# Patient Record
Sex: Female | Born: 1980 | Race: Black or African American | Hispanic: No | Marital: Single | Smoking: Never smoker
Health system: Southern US, Community
[De-identification: ages and names within clinical notes are randomized; demographics above are authoritative.]

## PROBLEM LIST (undated history)

## (undated) DIAGNOSIS — I10 Essential (primary) hypertension: Secondary | ICD-10-CM

## (undated) DIAGNOSIS — J45909 Unspecified asthma, uncomplicated: Secondary | ICD-10-CM

## (undated) DIAGNOSIS — F419 Anxiety disorder, unspecified: Secondary | ICD-10-CM

## (undated) HISTORY — DX: Unspecified asthma, uncomplicated: J45.909

## (undated) HISTORY — DX: Anxiety disorder, unspecified: F41.9

## (undated) HISTORY — DX: Essential (primary) hypertension: I10

---
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
## 2001-01-27 ENCOUNTER — Inpatient Hospital Stay (HOSPITAL_COMMUNITY): Admission: AD | Admit: 2001-01-27 | Discharge: 2001-01-29 | Payer: Self-pay | Admitting: *Deleted

## 2002-04-02 ENCOUNTER — Ambulatory Visit (HOSPITAL_COMMUNITY): Admission: RE | Admit: 2002-04-02 | Discharge: 2002-04-02 | Payer: Self-pay | Admitting: *Deleted

## 2002-04-09 ENCOUNTER — Emergency Department (HOSPITAL_COMMUNITY): Admission: EM | Admit: 2002-04-09 | Discharge: 2002-04-09 | Payer: Self-pay | Admitting: Emergency Medicine

## 2002-07-10 ENCOUNTER — Inpatient Hospital Stay (HOSPITAL_COMMUNITY): Admission: AD | Admit: 2002-07-10 | Discharge: 2002-07-12 | Payer: Self-pay | Admitting: *Deleted

## 2003-07-05 ENCOUNTER — Ambulatory Visit (HOSPITAL_COMMUNITY): Admission: RE | Admit: 2003-07-05 | Discharge: 2003-07-05 | Payer: Self-pay | Admitting: *Deleted

## 2003-07-29 ENCOUNTER — Ambulatory Visit (HOSPITAL_COMMUNITY): Admission: RE | Admit: 2003-07-29 | Discharge: 2003-07-29 | Payer: Self-pay | Admitting: *Deleted

## 2003-08-13 ENCOUNTER — Inpatient Hospital Stay (HOSPITAL_COMMUNITY): Admission: AD | Admit: 2003-08-13 | Discharge: 2003-08-15 | Payer: Self-pay | Admitting: *Deleted

## 2005-02-22 ENCOUNTER — Ambulatory Visit (HOSPITAL_COMMUNITY): Admission: RE | Admit: 2005-02-22 | Discharge: 2005-02-22 | Payer: Self-pay | Admitting: *Deleted

## 2005-03-26 ENCOUNTER — Ambulatory Visit (HOSPITAL_COMMUNITY): Admission: RE | Admit: 2005-03-26 | Discharge: 2005-03-26 | Payer: Self-pay | Admitting: *Deleted

## 2005-05-23 ENCOUNTER — Encounter (INDEPENDENT_AMBULATORY_CARE_PROVIDER_SITE_OTHER): Payer: Self-pay | Admitting: Specialist

## 2005-05-23 ENCOUNTER — Inpatient Hospital Stay (HOSPITAL_COMMUNITY): Admission: AD | Admit: 2005-05-23 | Discharge: 2005-05-25 | Payer: Self-pay | Admitting: *Deleted

## 2005-05-23 ENCOUNTER — Ambulatory Visit: Payer: Self-pay | Admitting: *Deleted

## 2008-07-05 ENCOUNTER — Emergency Department (HOSPITAL_COMMUNITY): Admission: EM | Admit: 2008-07-05 | Discharge: 2008-07-06 | Payer: Self-pay | Admitting: Emergency Medicine

## 2009-08-10 ENCOUNTER — Emergency Department (HOSPITAL_COMMUNITY): Admission: EM | Admit: 2009-08-10 | Discharge: 2009-08-10 | Payer: Self-pay | Admitting: Emergency Medicine

## 2010-04-12 ENCOUNTER — Emergency Department (HOSPITAL_COMMUNITY): Admission: EM | Admit: 2010-04-12 | Discharge: 2010-04-12 | Payer: Self-pay | Admitting: Family Medicine

## 2010-11-30 NOTE — Op Note (Signed)
NAME:  Terry Osborn, Terry Osborn           ACCOUNT NO.:  [id number]   MEDICAL RECORD NO.:  [id number]          PATIENT TYPE:  INP   LOCATION:  9135                          FACILITY:  [facility]   PHYSICIAN:  Conni Elliot, M.D.DATE OF BIRTH:  1981/06/01   DATE OF PROCEDURE:  05/23/2005  DATE OF DISCHARGE:  05/25/2005                                 OPERATIVE REPORT   PREOPERATIVE DIAGNOSIS:  Multiparity, desires permanent sterilization.   POSTOPERATIVE DIAGNOSIS:  Multiparity, desires permanent sterilization.   PROCEDURE:  Postpartum tubal ligation.   SURGEON:  Conni Elliot, M.D.   ASSISTANT:  Marc Morgans. Mayford Knife, M.D.   ANESTHESIA:  Epidural.   COMPLICATIONS:  None.   ESTIMATED BLOOD LOSS:  Less than 25 mL.   INDICATIONS FOR PROCEDURE:  The patient is a 30 year old multiparous female,  status post normal spontaneous vaginal delivery, who desires permanent  sterilization.  Risks and benefits of the procedure discussed with the  patient including risk of failure of 3-5 per 1000 with increased risk of  ectopic gestation if pregnancy does occur.   FINDINGS:  Normal uterus, tubes, and ovaries.   DESCRIPTION OF PROCEDURE:  The patient was taken to the operating room where  epidural anesthesia was found to be adequate.  A small transverse  infraumbilical incision was then made with the scalpel.  The incision was  carried down to the underlying fascia until the peritoneum was identified  and entered.  The peritoneum was noted to be free of any adhesions and the  incision was extended with Mayo scissors.  The patient's right fallopian  tube was then identified, brought to the incision, and grasped with Babcock  clamps.  The tube was followed out to the fimbria.  The Babcock clamp was  then used to grasp the tube approximately 4 cm from the cornual region.  A 2-  0 chromic on a SH needle was then used and approximately a 3 cm segment of  the tube was ligated.  A second free tie of 1-0  chromic was used to compare  to the previous suture.  Next, a segment of the tube was excised.  Hemostasis was noted and the tube was adherent to the abdomen.   The left fallopian tube was then grasped with a Babcock clamp and followed  out to the fimbria and then in a similar fashion, a 2-0 chromic on an SH  needle was used and approximately a 3 cm segment of the tube was made into a  knuckle and ligated.  A second free tie of 1-0 chromic was used as inferior  to the previous suture.  That part of the tube was excised.  Excellent  hemostasis was also noted and the tube was returned to the abdomen.  The  peritoneum was closed with 0 chromic on a CT1.  The fascia was closed in a  single layer using 0 Vicryl.  Subcutaneous tissue was closed with a 3-0  Monocryl.  Finally, the skin was closed with 4-0 Vicryl.  The patient  tolerated the procedure  well.  Needle, sponge, and instrument counts correct x2.  The patient  was  taken to the recovery room in stable condition.   PATHOLOGY:  Segments of the right and left fallopian tube.     ______________________________  Marc Morgans Mayford Knife, M.D.    ______________________________  Conni Elliot, M.D.    TLW/MEDQ  D:  05/27/2005  T:  05/28/2005  Job:  [id number]

## 2011-04-18 LAB — URINALYSIS, ROUTINE W REFLEX MICROSCOPIC
Glucose, UA: NEGATIVE mg/dL
Hgb urine dipstick: NEGATIVE
Ketones, ur: NEGATIVE mg/dL
Protein, ur: NEGATIVE mg/dL
Urobilinogen, UA: 0.2 mg/dL (ref 0.0–1.0)

## 2011-04-18 LAB — POCT I-STAT, CHEM 8
BUN: 9 mg/dL (ref 6–23)
Calcium, Ion: 1.21 mmol/L (ref 1.12–1.32)
Chloride: 102 mEq/L (ref 96–112)
Creatinine, Ser: 0.9 mg/dL (ref 0.4–1.2)
Glucose, Bld: 104 mg/dL — ABNORMAL HIGH (ref 70–99)
TCO2: 30 mmol/L (ref 0–100)

## 2012-06-30 ENCOUNTER — Encounter (HOSPITAL_COMMUNITY): Payer: Self-pay | Admitting: *Deleted

## 2012-06-30 DIAGNOSIS — R0789 Other chest pain: Secondary | ICD-10-CM | POA: Insufficient documentation

## 2012-06-30 DIAGNOSIS — R059 Cough, unspecified: Secondary | ICD-10-CM | POA: Insufficient documentation

## 2012-06-30 DIAGNOSIS — Z79899 Other long term (current) drug therapy: Secondary | ICD-10-CM | POA: Insufficient documentation

## 2012-06-30 DIAGNOSIS — R0602 Shortness of breath: Secondary | ICD-10-CM | POA: Insufficient documentation

## 2012-06-30 DIAGNOSIS — J069 Acute upper respiratory infection, unspecified: Secondary | ICD-10-CM | POA: Insufficient documentation

## 2012-06-30 DIAGNOSIS — Z8709 Personal history of other diseases of the respiratory system: Secondary | ICD-10-CM | POA: Insufficient documentation

## 2012-06-30 DIAGNOSIS — J029 Acute pharyngitis, unspecified: Secondary | ICD-10-CM | POA: Insufficient documentation

## 2012-06-30 DIAGNOSIS — R05 Cough: Secondary | ICD-10-CM | POA: Insufficient documentation

## 2012-06-30 LAB — CBC WITH DIFFERENTIAL/PLATELET
Basophils Absolute: 0 10*3/uL (ref 0.0–0.1)
Eosinophils Absolute: 0.3 10*3/uL (ref 0.0–0.7)
Eosinophils Relative: 5 % (ref 0–5)
HCT: 39.6 % (ref 36.0–46.0)
MCH: 31 pg (ref 26.0–34.0)
MCV: 87.6 fL (ref 78.0–100.0)
Monocytes Absolute: 0.5 10*3/uL (ref 0.1–1.0)
Platelets: 284 10*3/uL (ref 150–400)
RDW: 12 % (ref 11.5–15.5)

## 2012-06-30 LAB — COMPREHENSIVE METABOLIC PANEL
ALT: 11 U/L (ref 0–35)
AST: 19 U/L (ref 0–37)
CO2: 27 mEq/L (ref 19–32)
Calcium: 9.5 mg/dL (ref 8.4–10.5)
Creatinine, Ser: 0.9 mg/dL (ref 0.50–1.10)
GFR calc Af Amer: >90 mL/min (ref >90)
GFR calc non Af Amer: 85 mL/min — ABNORMAL LOW (ref >90)
Glucose, Bld: 96 mg/dL (ref 70–99)
Sodium: 138 mEq/L (ref 135–145)
Total Protein: 7.6 g/dL (ref 6.0–8.3)

## 2012-06-30 NOTE — ED Notes (Signed)
The pt is c/o chest tightness for the past 1-2 days.  It started  Causing anxiety tonight.  Also c/o sob none now.  lmp  2 weeks ago

## 2012-07-01 ENCOUNTER — Emergency Department (HOSPITAL_COMMUNITY): Payer: Medicaid Other

## 2012-07-01 ENCOUNTER — Emergency Department (HOSPITAL_COMMUNITY)
Admission: EM | Admit: 2012-07-01 | Discharge: 2012-07-01 | Disposition: A | Discharge status: Home / Self Care | Payer: Medicaid Other | Attending: Emergency Medicine | Admitting: Emergency Medicine

## 2012-07-01 DIAGNOSIS — R0789 Other chest pain: Secondary | ICD-10-CM

## 2012-07-01 DIAGNOSIS — J069 Acute upper respiratory infection, unspecified: Secondary | ICD-10-CM

## 2012-07-01 MED ORDER — IPRATROPIUM BROMIDE 0.03 % NA SOLN: 2.0000 | Freq: Two times a day (BID) | NASAL | Status: DC

## 2012-07-01 MED ORDER — BENZONATATE 100 MG PO CAPS: 200.0000 mg | ORAL_CAPSULE | Freq: Two times a day (BID) | ORAL | Status: DC | PRN

## 2012-07-01 MED ORDER — AEROCHAMBER PLUS W/MASK MISC
Status: AC
  Administered 2012-07-01: 01:00:00
  Filled 2012-07-01: qty 1

## 2012-07-01 MED ORDER — ALBUTEROL SULFATE HFA 108 (90 BASE) MCG/ACT IN AERS
2.0000 | INHALATION_SPRAY | Freq: Once | RESPIRATORY_TRACT | Status: AC
  Administered 2012-07-01: 2 via RESPIRATORY_TRACT
  Filled 2012-07-01: qty 6.7

## 2012-07-01 MED ORDER — GUAIFENESIN ER 600 MG PO TB12: 1200.0000 mg | ORAL_TABLET | Freq: Two times a day (BID) | ORAL | Status: DC

## 2012-07-01 NOTE — ED Provider Notes (Signed)
History     CSN: [id number]  Arrival date & time 06/30/12  2155   None     Chief Complaint  Patient presents with  . Chest Pain    (Consider location/radiation/quality/duration/timing/severity/associated sxs/prior treatment) The history is provided by the patient and medical records.    Terry Osborn is a 31 y.o. female  with a hx of childhood asthma presents to the Emergency Department complaining of gradual, persistent, progressively worsening chest pain onset 2 days ago.  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  Pt denies trauma, lifting anything heavy or new/ strenuous activities.  Pt rates her pain at a 5/10, located substernal, non radiating, described as tightness worse with breathing in.  Associated symptoms include rhinorrhea, sore throat, sinus pressure.   Nothing makes it better and deep breaths makes it worse.  Pt denies fever, chills, headaches, abdominal pain, nausea, vomiting, diarrhea, weakness, dizziness, syncope, dysuria.     History reviewed. No pertinent past medical history.  History reviewed. No pertinent past surgical history.  No family history on file.  History  Substance Use Topics  . Smoking status: Never Smoker   . Smokeless tobacco: Not on file  . Alcohol Use: No    OB History    Grav Para Term Preterm Abortions TAB SAB Ect Mult Living                  Review of Systems  Constitutional: Negative for fever, diaphoresis, appetite change, fatigue and unexpected weight change.  HENT: Positive for congestion, sore throat, rhinorrhea and postnasal drip. Negative for mouth sores, neck pain and neck stiffness.   Eyes: Negative for visual disturbance.  Respiratory: Positive for cough, chest tightness and shortness of breath. Negative for wheezing.   Cardiovascular: Negative for chest pain.  Gastrointestinal: Negative for nausea, vomiting, abdominal pain, diarrhea and constipation.  Genitourinary: Negative for  dysuria, urgency, frequency and hematuria.  Skin: Negative for rash.  Neurological: Negative for syncope, light-headedness and headaches.  Psychiatric/Behavioral: Negative for sleep disturbance. The patient is not nervous/anxious.   All other systems reviewed and are negative.    Allergies  Review of patient's allergies indicates no known allergies.  Home Medications   Current Outpatient Rx  Name  Route  Sig  Dispense  Refill  . VALSARTAN-HYDROCHLOROTHIAZIDE 160-12.5 MG PO TABS   Oral   Take 1 tablet by mouth daily.         Marland Kitchen BENZONATATE 100 MG PO CAPS   Oral   Take 2 capsules (200 mg total) by mouth 2 (two) times daily as needed for cough.   20 capsule   0   . GUAIFENESIN ER 600 MG PO TB12   Oral   Take 2 tablets (1,200 mg total) by mouth 2 (two) times daily.   20 tablet   0   . IPRATROPIUM BROMIDE 0.03 % NA SOLN   Nasal   Place 2 sprays into the nose 2 (two) times daily. PRN congestion   30 mL   0     BP 154/85  Pulse 88  Temp 99 F (37.2 C) (Oral)  Resp 18  SpO2 100%  LMP 06/16/2012  Physical Exam  Nursing note and vitals reviewed. Constitutional: She is oriented to person, place, and time. She appears well-developed and well-nourished. No distress.  HENT:  Head: Normocephalic and atraumatic.  Right Ear: Tympanic membrane, external ear and ear canal normal.  Left Ear: Tympanic membrane, external ear and ear  canal normal.  Nose: Nose normal. Right sinus exhibits no maxillary sinus tenderness and no frontal sinus tenderness. Left sinus exhibits no maxillary sinus tenderness and no frontal sinus tenderness.  Mouth/Throat: Uvula is midline, oropharynx is clear and moist and mucous membranes are normal. No oropharyngeal exudate, posterior oropharyngeal edema, posterior oropharyngeal erythema or tonsillar abscesses.  Eyes: Conjunctivae normal and EOM are normal. Pupils are equal, round, and reactive to light. No scleral icterus.  Neck: Normal range of motion.  Neck supple.  Cardiovascular: Normal rate, regular rhythm, normal heart sounds and intact distal pulses.  Exam reveals no gallop and no friction rub.   No murmur heard. Pulmonary/Chest: Effort normal. No accessory muscle usage. Not tachypneic. No respiratory distress. She has decreased breath sounds (throughout). She has no wheezes. She has no rhonchi. She has no rales. She exhibits no tenderness.  Abdominal: Soft. Bowel sounds are normal. She exhibits no mass. There is no tenderness. There is no rigidity, no rebound, no guarding, no CVA tenderness, no tenderness at McBurney's point and negative Murphy's sign.  Musculoskeletal: Normal range of motion. She exhibits no edema.  Lymphadenopathy:    She has no cervical adenopathy.  Neurological: She is alert and oriented to person, place, and time. She exhibits normal muscle tone. Coordination normal.       Speech is clear and goal oriented Moves extremities without ataxia  Skin: Skin is warm and dry. No rash noted. She is not diaphoretic. No erythema.  Psychiatric: Her mood appears anxious (mildly).    ED Course  Procedures (including critical care time)  Labs Reviewed  COMPREHENSIVE METABOLIC PANEL - Abnormal; Notable for the following:    GFR calc non Af Amer 85 (*)     All other components within normal limits  CBC WITH DIFFERENTIAL  TROPONIN I   Dg Chest 2 View  07/01/2012  *RADIOLOGY REPORT*  Clinical Data: Chest pain  CHEST - 2 VIEW  Comparison: None.  Findings: Lungs are clear.  No pleural effusion or pneumothorax.  Cardiomediastinal silhouette is within normal limits.  Visualized osseous structures are within normal limits.  IMPRESSION: No evidence of acute cardiopulmonary disease.   Original Report Authenticated By: Charline Bills, M.D.     Results for orders placed during the hospital encounter of 07/01/12  CBC WITH DIFFERENTIAL      Component Value Range   WBC 6.8  4.0 - 10.5 K/uL   RBC 4.52  3.87 - 5.11 MIL/uL    Hemoglobin 14.0  12.0 - 15.0 g/dL   HCT 16.1  09.6 - 04.5 %   MCV 87.6  78.0 - 100.0 fL   MCH 31.0  26.0 - 34.0 pg   MCHC 35.4  30.0 - 36.0 g/dL   RDW 40.9  81.1 - 91.4 %   Platelets 284  150 - 400 K/uL   Neutrophils Relative 51  43 - 77 %   Neutro Abs 3.5  1.7 - 7.7 K/uL   Lymphocytes Relative 37  12 - 46 %   Lymphs Abs 2.5  0.7 - 4.0 K/uL   Monocytes Relative 7  3 - 12 %   Monocytes Absolute 0.5  0.1 - 1.0 K/uL   Eosinophils Relative 5  0 - 5 %   Eosinophils Absolute 0.3  0.0 - 0.7 K/uL   Basophils Relative 1  0 - 1 %   Basophils Absolute 0.0  0.0 - 0.1 K/uL  COMPREHENSIVE METABOLIC PANEL      Component Value Range   Sodium  138  135 - 145 mEq/L   Potassium 3.8  3.5 - 5.1 mEq/L   Chloride 100  96 - 112 mEq/L   CO2 27  19 - 32 mEq/L   Glucose, Bld 96  70 - 99 mg/dL   BUN 9  6 - 23 mg/dL   Creatinine, Ser 1.61  0.50 - 1.10 mg/dL   Calcium 9.5  8.4 - 09.6 mg/dL   Total Protein 7.6  6.0 - 8.3 g/dL   Albumin 3.9  3.5 - 5.2 g/dL   AST 19  0 - 37 U/L   ALT 11  0 - 35 U/L   Alkaline Phosphatase 55  39 - 117 U/L   Total Bilirubin 0.3  0.3 - 1.2 mg/dL   GFR calc non Af Amer 85 (*) >90 mL/min   GFR calc Af Amer >90  >90 mL/min  TROPONIN I      Component Value Range   Troponin I <0.30  <0.30 ng/mL   No results found.   ECG:  Date: 07/01/2012  Rate: 85  Rhythm: normal sinus rhythm  QRS Axis: normal  Intervals: normal  ST/T Wave abnormalities: normal  Conduction Disutrbances:none  Narrative Interpretation: Nonischemic ECG  Old EKG Reviewed: none available    1. URI (upper respiratory infection)   2. Chest tightness       MDM  Terry Osborn presents with chest tightness, mild URI symptoms and some anxiety.  On reevaluation lung sounds have increased tidal volume without wheezing or rhonchi. ECG nonischemic, CMP, CBC unremarkable, troponin negative and chest x-ray without acute infiltrate, effusion or pneumothorax. Patient is to be discharged with recommendation to  follow up with PCP in regards to today's hospital visit. Chest pain is not likely of cardiac or pulmonary etiology d/t presentation, perc negative, VSS, no tracheal deviation, no JVD or new murmur, RRR, breath sounds equal bilaterally, EKG without acute abnormalities, negative troponin, and negative CXR. Chest tightness resolved with albuterol administration, this is most likely URI in nature. Pt has been given URI treatments, albuterol MDI and advised to return to the ED if CP becomes exertional, associated with diaphoresis or nausea, radiates to left jaw/arm, worsens or becomes concerning in any way. Pt appears reliable for follow up and is agreeable to discharge.   Case has been discussed with Dr. Sunnie Nielsen who agrees with the above plan to discharge.         Terry Client Mystic Labo, PA-C 07/01/12 [phone]

## 2012-07-04 NOTE — ED Provider Notes (Signed)
Medical screening examination/treatment/procedure(s) were performed by non-physician practitioner and as supervising physician I was immediately available for consultation/collaboration.  Sunnie Nielsen, MD 07/04/12 [phone]

## 2013-09-16 ENCOUNTER — Encounter: Payer: Self-pay | Admitting: Advanced Practice Midwife

## 2015-04-28 ENCOUNTER — Emergency Department (HOSPITAL_COMMUNITY)
Admission: EM | Admit: 2015-04-28 | Discharge: 2015-04-28 | Disposition: A | Discharge status: Home / Self Care | Payer: Medicaid Other | Attending: Emergency Medicine | Admitting: Emergency Medicine

## 2015-04-28 ENCOUNTER — Emergency Department (HOSPITAL_COMMUNITY)
Admission: EM | Admit: 2015-04-28 | Discharge: 2015-04-28 | Discharge status: Absent without leave | Payer: Medicaid Other | Source: Home / Self Care

## 2015-04-28 ENCOUNTER — Encounter (HOSPITAL_COMMUNITY): Payer: Self-pay | Admitting: Family Medicine

## 2015-04-28 ENCOUNTER — Emergency Department (HOSPITAL_COMMUNITY): Payer: Medicaid Other

## 2015-04-28 DIAGNOSIS — F458 Other somatoform disorders: Secondary | ICD-10-CM | POA: Insufficient documentation

## 2015-04-28 DIAGNOSIS — Z79899 Other long term (current) drug therapy: Secondary | ICD-10-CM | POA: Insufficient documentation

## 2015-04-28 DIAGNOSIS — F419 Anxiety disorder, unspecified: Secondary | ICD-10-CM | POA: Diagnosis not present

## 2015-04-28 LAB — BASIC METABOLIC PANEL
Anion gap: 8 (ref 5–15)
BUN: 8 mg/dL (ref 6–20)
CALCIUM: 9.6 mg/dL (ref 8.9–10.3)
CO2: 29 mmol/L (ref 22–32)
CREATININE: 0.86 mg/dL (ref 0.44–1.00)
Chloride: 102 mmol/L (ref 101–111)
GFR calc non Af Amer: >60 mL/min (ref >60)
Glucose, Bld: 101 mg/dL — ABNORMAL HIGH (ref 65–99)
Potassium: 3.1 mmol/L — ABNORMAL LOW (ref 3.5–5.1)
SODIUM: 139 mmol/L (ref 135–145)

## 2015-04-28 LAB — CBC
HCT: 40.3 % (ref 36.0–46.0)
Hemoglobin: 13.9 g/dL (ref 12.0–15.0)
MCH: 30.2 pg (ref 26.0–34.0)
MCHC: 34.5 g/dL (ref 30.0–36.0)
MCV: 87.6 fL (ref 78.0–100.0)
PLATELETS: 309 10*3/uL (ref 150–400)
RBC: 4.6 MIL/uL (ref 3.87–5.11)
RDW: 12.2 % (ref 11.5–15.5)
WBC: 5.6 10*3/uL (ref 4.0–10.5)

## 2015-04-28 LAB — I-STAT TROPONIN, ED: TROPONIN I, POC: 0 ng/mL (ref 0.00–0.08)

## 2015-04-28 MED ORDER — LORAZEPAM 0.5 MG PO TABS
1.0000 mg | ORAL_TABLET | Freq: Once | ORAL | Status: AC
  Administered 2015-04-28: 1 mg via ORAL
  Filled 2015-04-28: qty 2

## 2015-04-28 MED ORDER — POTASSIUM CHLORIDE CRYS ER 20 MEQ PO TBCR
40.0000 meq | EXTENDED_RELEASE_TABLET | Freq: Once | ORAL | Status: AC
  Administered 2015-04-28: 40 meq via ORAL
  Filled 2015-04-28: qty 2

## 2015-04-28 NOTE — ED Provider Notes (Signed)
CSN: [id number]     Arrival date & time 04/28/15  1417 History  By signing my name below, I, Soijett Blue, attest that this documentation has been prepared under the direction and in the presence of Roxy Horsemanobert Jermaine Tholl, PA-C Electronically Signed: Soijett Blue, ED Scribe. 04/28/2015. 7:39 PM.   Chief Complaint  Patient presents with  . Anxiety      The history is provided by the patient. No language interpreter was used.    HPI Comments: Terry Osborn is a [age] y.o. female who presents to the Emergency Department complaining of anxiety onset earlier today. She notes that she has an un documented hx of anxiety and she takes vistaril for her symptoms PRN. She thinks that her anxiety medications are no longer working for her symptoms especially today. She states that she is having associated symptoms of CP due to anxiety and difficulty breathing, all of which have resolved since being in the ED. She states that she has tried vistaril with no relief for her symptoms. She denies any other symptoms.   History reviewed. No pertinent past medical history. History reviewed. No pertinent past surgical history. History reviewed. No pertinent family history. Social History  Substance Use Topics  . Smoking status: Never Smoker   . Smokeless tobacco: None  . Alcohol Use: No   OB History    No data available     Review of Systems  Constitutional: Negative for fever and chills.       Anxiety that is resolved  HENT:       Globus sensation  Respiratory: Negative for shortness of breath.   Cardiovascular: Positive for chest pain (resolved).  Gastrointestinal: Negative for nausea, vomiting, diarrhea and constipation.  Genitourinary: Negative for dysuria.  All other systems reviewed and are negative.     Allergies  Review of patient's allergies indicates no known allergies.  Home Medications   Prior to Admission medications   Medication Sig Start Date End Date Taking? Authorizing Provider   Aspirin-Acetaminophen-Caffeine (GOODY HEADACHE PO) Take 1 packet by mouth daily as needed (headache).   Yes Historical Provider, MD  hydrOXYzine (VISTARIL) 25 MG capsule Take 25 mg by mouth daily as needed for anxiety.   Yes Historical Provider, MD  losartan-hydrochlorothiazide (HYZAAR) 50-12.5 MG tablet Take 1 tablet by mouth daily. 04/04/15  Yes Historical Provider, MD  benzonatate (TESSALON) 100 MG capsule Take 2 capsules (200 mg total) by mouth 2 (two) times daily as needed for cough. Patient not taking: Reported on 04/28/2015 07/01/12   Dahlia ClientHannah Muthersbaugh, PA-C  guaiFENesin (MUCINEX) 600 MG 12 hr tablet Take 2 tablets (1,200 mg total) by mouth 2 (two) times daily. Patient not taking: Reported on 04/28/2015 07/01/12   Dahlia ClientHannah Muthersbaugh, PA-C  ipratropium (ATROVENT) 0.03 % nasal spray Place 2 sprays into the nose 2 (two) times daily. PRN congestion Patient not taking: Reported on 04/28/2015 07/01/12   Dahlia ClientHannah Muthersbaugh, PA-C   BP 139/84 mmHg  Pulse 107  Temp(Src) 98.8 F (37.1 C) (Oral)  Resp 22  SpO2 98%  LMP 04/14/2015 Physical Exam  Constitutional: She is oriented to person, place, and time. She appears well-developed and well-nourished. No distress.  HENT:  Head: Normocephalic and atraumatic.  Eyes: Conjunctivae and EOM are normal. Pupils are equal, round, and reactive to light.  Neck: Normal range of motion. Neck supple.  Cardiovascular: Normal rate, regular rhythm and normal heart sounds.  Exam reveals no gallop and no friction rub.   No murmur heard. Pulmonary/Chest: Effort normal and breath  sounds normal. No respiratory distress. She has no wheezes. She has no rales. She exhibits no tenderness.  Abdominal: Soft. She exhibits no distension and no mass. There is no tenderness. There is no rebound and no guarding.  Musculoskeletal: Normal range of motion. She exhibits no edema or tenderness.  Neurological: She is alert and oriented to person, place, and time.  Skin: Skin is  warm and dry.  Psychiatric: She has a normal mood and affect. Her behavior is normal. Judgment and thought content normal.  Nursing note and vitals reviewed.   ED Course  Procedures (including critical care time) DIAGNOSTIC STUDIES: Oxygen Saturation is 98% on RA, nl by my interpretation.    COORDINATION OF CARE: 7:38 PM Discussed treatment plan with pt at bedside which includes potassium tablet, CXR, EKG, labs, ativan, and continue with vistaril and pt agreed to plan.    Labs Review Labs Reviewed  BASIC METABOLIC PANEL - Abnormal; Notable for the following:    Potassium 3.1 (*)    Glucose, Bld 101 (*)    All other components within normal limits  CBC  I-STAT TROPOININ, ED    Imaging Review Dg Chest 2 View  04/28/2015  CLINICAL DATA:  Chest pain for 1 day EXAM: CHEST  2 VIEW COMPARISON:  07/01/2012 FINDINGS: There is no focal parenchymal opacity. There is no pleural effusion or pneumothorax. The heart and mediastinal contours are unremarkable. There is gaseous distension of the small bowel and colon. The osseous structures are unremarkable. IMPRESSION: No active cardiopulmonary disease. Electronically Signed   By: Elige Ko   On: 04/28/2015 18:37   I have personally reviewed and evaluated these images and lab results as part of my medical decision-making.   EKG Interpretation   Date/Time:  Friday April 28 2015 14:30:10 EDT Ventricular Rate:  92 PR Interval:  168 QRS Duration: 86 QT Interval:  340 QTC Calculation: 420 R Axis:   86 Text Interpretation:  Normal sinus rhythm Normal ECG No significant change  since last tracing Confirmed by LITTLE MD, RACHEL ([id number]) on 04/28/2015  6:28:42 PM      MDM   Final diagnoses:  Anxiety    Patient with anxiety.  Reports feeling much better after being moved out of the waiting room. She states that she has a history of the same, and takes hydroxyzine as needed. She states that this did not control her symptoms today. Her  laboratory studies are reassuring. She was given a dose potassium in the emergency department. EKG is reassuring. Low risk for PE or DVT. No leg swelling. Patient is stable and ready for discharge.  I, Jancy Sprankle, personally performed the services described in this documentation. All medical record entries made by the scribe were at my direction and in my presence.  I have reviewed the chart and discharge instructions and agree that the record reflects my personal performance and is accurate and complete. Brooksie Ellwanger.  04/28/2015. 8:12 PM.      Roxy Horseman, PA-C 04/28/15 2013  Glynn Octave, MD 04/28/15 [phone]

## 2015-04-28 NOTE — ED Notes (Signed)
Pt here for anxiety. sts chest pain associated with it. Pt crying at triage. sts anxiety meds aren't helping.

## 2015-04-28 NOTE — Discharge Instructions (Signed)
°Panic Attacks °Panic attacks are sudden, short-lived surges of severe anxiety, fear, or discomfort. They may occur for no reason when you are relaxed, when you are anxious, or when you are sleeping. Panic attacks may occur for a number of reasons:  °· Healthy people occasionally have panic attacks in extreme, life-threatening situations, such as war or natural disasters. Normal anxiety is a protective mechanism of the body that helps us react to danger (fight or flight response). °· Panic attacks are often seen with anxiety disorders, such as panic disorder, social anxiety disorder, generalized anxiety disorder, and phobias. Anxiety disorders cause excessive or uncontrollable anxiety. They may interfere with your relationships or other life activities. °· Panic attacks are sometimes seen with other mental illnesses, such as depression and posttraumatic stress disorder. °· Certain medical conditions, prescription medicines, and drugs of abuse can cause panic attacks. °SYMPTOMS  °Panic attacks start suddenly, peak within 20 minutes, and are accompanied by four or more of the following symptoms: °· Pounding heart or fast heart rate (palpitations). °· Sweating. °· Trembling or shaking. °· Shortness of breath or feeling smothered. °· Feeling choked. °· Chest pain or discomfort. °· Nausea or strange feeling in your stomach. °· Dizziness, light-headedness, or feeling like you will faint. °· Chills or hot flushes. °· Numbness or tingling in your lips or hands and feet. °· Feeling that things are not real or feeling that you are not yourself. °· Fear of losing control or going crazy. °· Fear of dying. °Some of these symptoms can mimic serious medical conditions. For example, you may think you are having a heart attack. Although panic attacks can be very scary, they are not life threatening. °DIAGNOSIS  °Panic attacks are diagnosed through an assessment by your health care provider. Your health care provider will ask  questions about your symptoms, such as where and when they occurred. Your health care provider will also ask about your medical history and use of alcohol and drugs, including prescription medicines. Your health care provider may order blood tests or other studies to rule out a serious medical condition. Your health care provider may refer you to a mental health professional for further evaluation. °TREATMENT  °· Most healthy people who have one or two panic attacks in an extreme, life-threatening situation will not require treatment. °· The treatment for panic attacks associated with anxiety disorders or other mental illness typically involves counseling with a mental health professional, medicine, or a combination of both. Your health care provider will help determine what treatment is best for you. °· Panic attacks due to physical illness usually go away with treatment of the illness. If prescription medicine is causing panic attacks, talk with your health care provider about stopping the medicine, decreasing the dose, or substituting another medicine. °· Panic attacks due to alcohol or drug abuse go away with abstinence. Some adults need professional help in order to stop drinking or using drugs. °HOME CARE INSTRUCTIONS  °· Take all medicines as directed by your health care provider.   °· Schedule and attend follow-up visits as directed by your health care provider. It is important to keep all your appointments. °SEEK MEDICAL CARE IF: °· You are not able to take your medicines as prescribed. °· Your symptoms do not improve or get worse. °SEEK IMMEDIATE MEDICAL CARE IF:  °· You experience panic attack symptoms that are different than your usual symptoms. °· You have serious thoughts about hurting yourself or others. °· You are taking medicine for panic attacks and   have a serious side effect. MAKE SURE YOU:  Understand these instructions.  Will watch your condition.  Will get help right away if you are not  doing well or get worse.   This information is not intended to replace advice given to you by your health care provider. Make sure you discuss any questions you have with your health care provider.   Document Released: 07/01/2005 Document Revised: 07/06/2013 Document Reviewed: 02/12/2013 Elsevier Interactive Patient Education 2016 [employer]    [facility] Resources in the [employer]  Intensive Outpatient Programs: [facility]      [address] [place], [place] [phone] Both a day and evening program       [facility]     [address]        [place], [place] [address] [phone]         [facility] [address] [place] [phone]  [facility] ACCESS LINE: [phone] or [phone] [address] [place], [place] [address] [email or web address]  Mobile Crisis Teams:                                        [facility]         Mobile Crisis Care Unit [phone]             [facility] [address] [place] [phone]                                         [facility] [address] [place] [phone]  Self-Help/Support Groups: [facility] [place] Variety of support groups [phone] (call for more info)  Narcotics Anonymous (NA) [facility] [address] [place] - 2 meetings at this location  Residential Treatment Programs:  [facility]      [address]        [place]       [phone]         [facility] [address], [place] [address] [place], [place]  [address] [phone]  [facility]  [address] [place], [place] [address] [phone] Admissions: 8am-3pm M-F  [facility]     [address]         [place], [place] [address]       [phone]         [facility] [address] [place], [place] [phone]  [facility] [address] [place], [place] [phone]  [facility] Intensive Outpatient      [address]     [place], [place]       [phone]         [facility]     [address] [place], [place] [phone] or [phone]  [facility] ([facility])  [address] [place], [place] [phone]  [facility]  [place]: [place]- [phone]               General Therapy                                                Domenic Schwab, PhD        [address] [place], [place] [address]         [facility]   [address] [place], [place] [address] [phone]  [facility] [address] [place], [place] [address] [phone] Insurance/Medicaid/sponsorship through [facility]                                              [address] [place]                                        [place], [place] [address]    Therapy/tele-psych/case         [place] [address],   [phone]  Adolescent/group home/case management [phone]                                           Rosette Reveal PhD       General therapy       Insurance   [phone]         Dr. Adele Schilder Insurance [phone] M-F  [facility], sponsorship [id number]

## 2017-09-24 IMAGING — DX DG CHEST 2V
2 series · 2 of 2 positions shown · non-contrast
Comparison: 07/01/2012

CLINICAL DATA: Chest pain for 1 day

EXAM:
CHEST  2 VIEW

[chest pa]
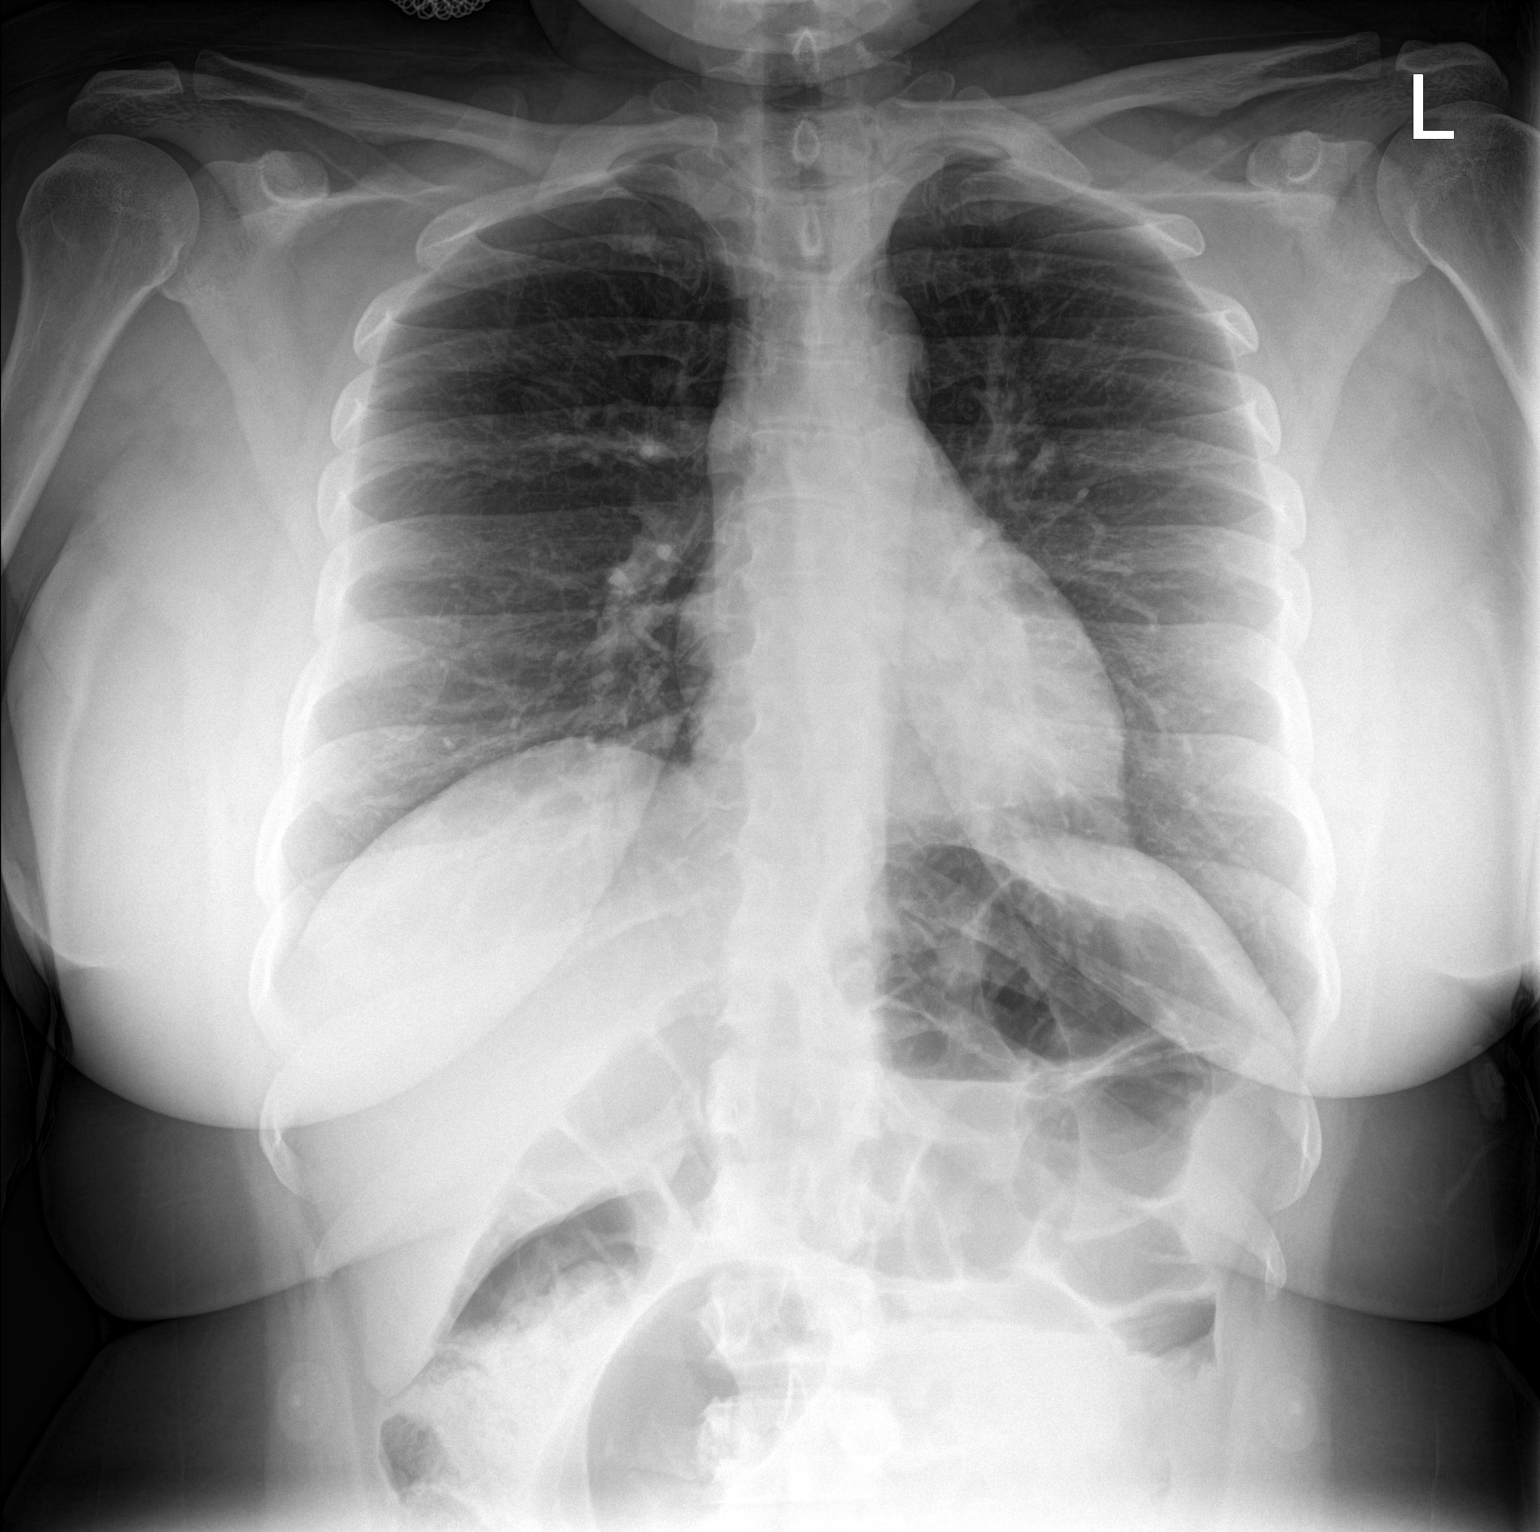

[chest lat]
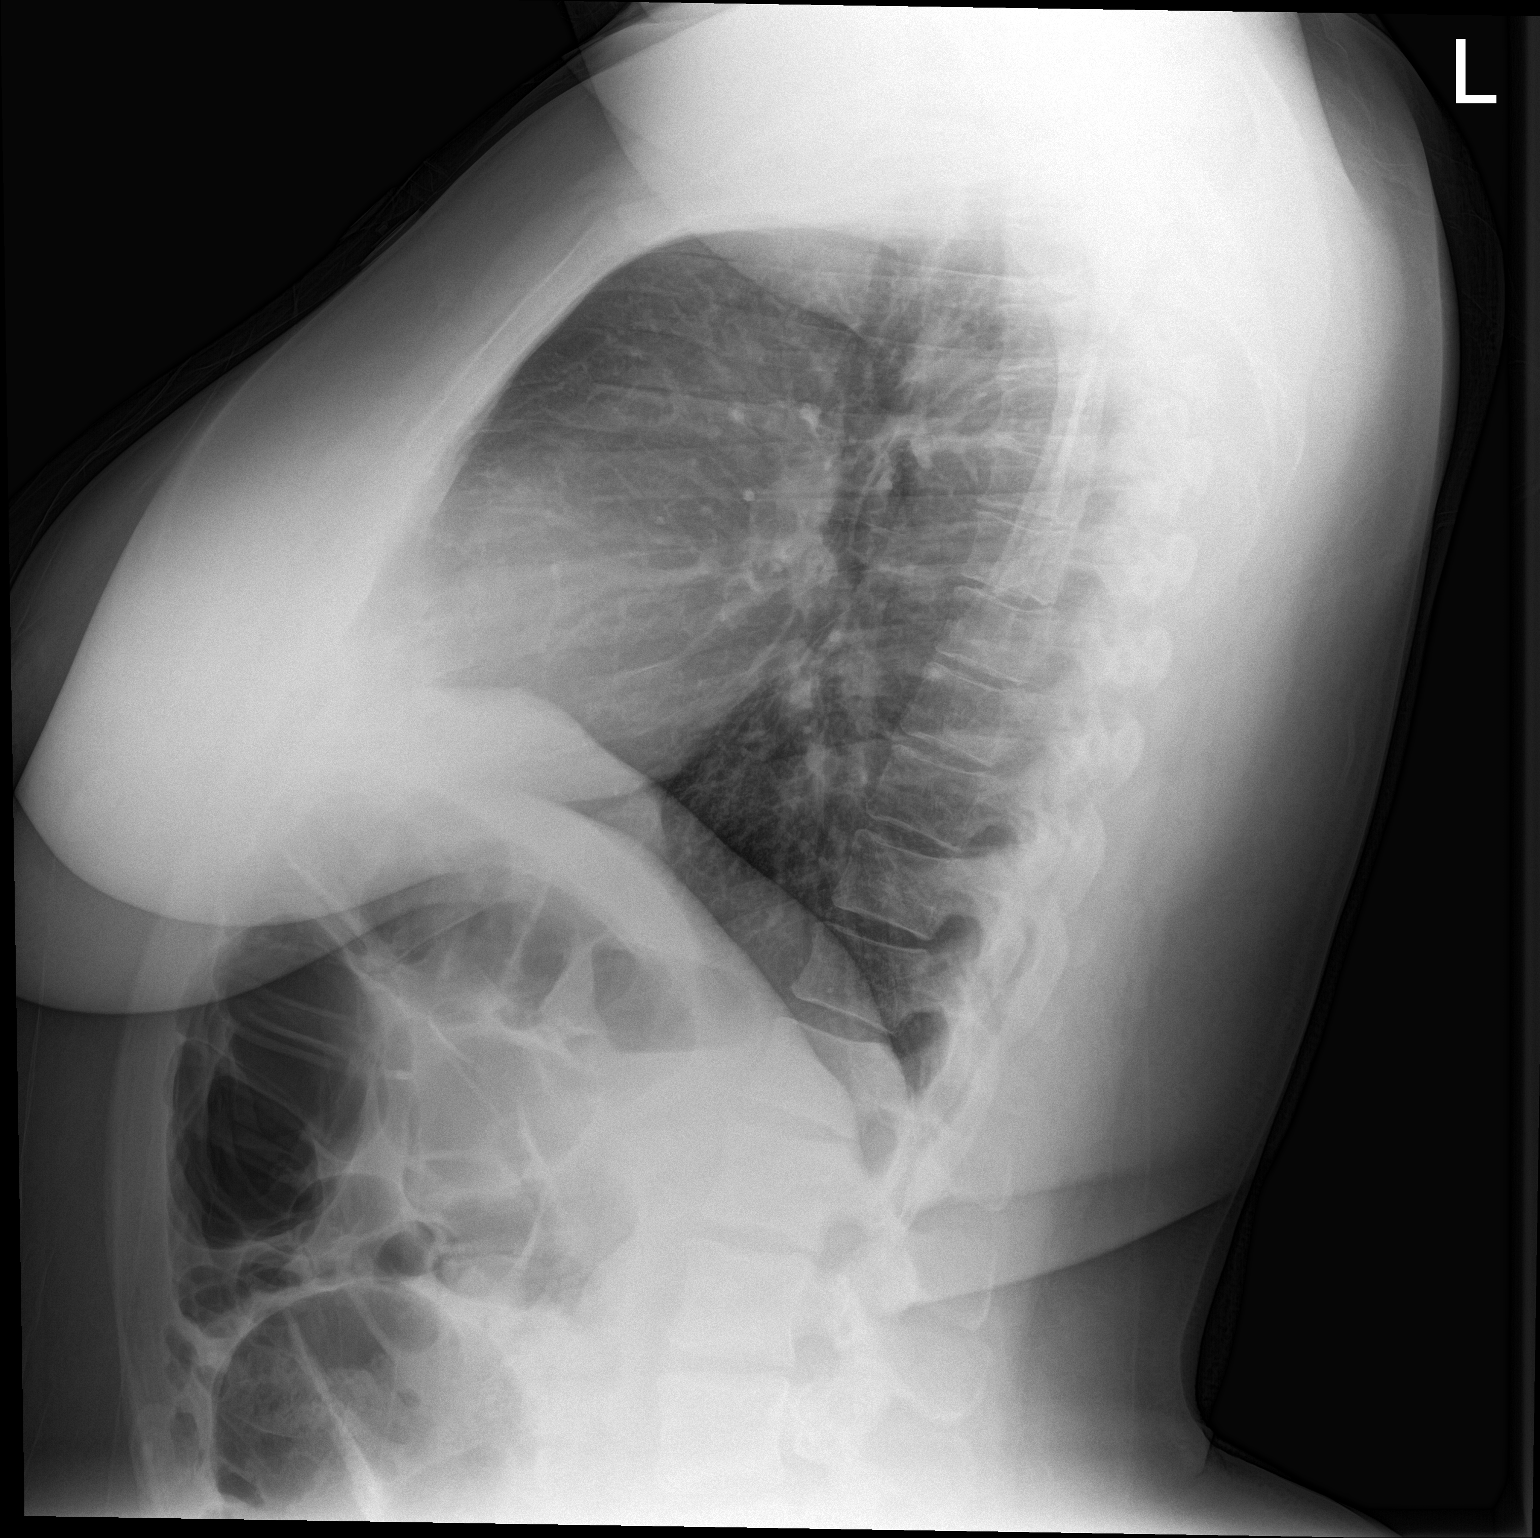

[2 of 2 positions shown; findings below may reference images not displayed]

FINDINGS: There is no focal parenchymal opacity. There is no pleural effusion
or pneumothorax. The heart and mediastinal contours are
unremarkable.

There is gaseous distension of the small bowel and colon.

The osseous structures are unremarkable.
IMPRESSION: No active cardiopulmonary disease.

## 2019-09-07 DIAGNOSIS — Z5181 Encounter for therapeutic drug level monitoring: Secondary | ICD-10-CM | POA: Diagnosis not present

## 2019-09-08 DIAGNOSIS — Z5181 Encounter for therapeutic drug level monitoring: Secondary | ICD-10-CM | POA: Diagnosis not present

## 2019-09-10 DIAGNOSIS — Z5181 Encounter for therapeutic drug level monitoring: Secondary | ICD-10-CM | POA: Diagnosis not present

## 2019-09-13 DIAGNOSIS — Z5181 Encounter for therapeutic drug level monitoring: Secondary | ICD-10-CM | POA: Diagnosis not present

## 2019-09-14 DIAGNOSIS — Z5181 Encounter for therapeutic drug level monitoring: Secondary | ICD-10-CM | POA: Diagnosis not present

## 2019-09-15 DIAGNOSIS — Z5181 Encounter for therapeutic drug level monitoring: Secondary | ICD-10-CM | POA: Diagnosis not present

## 2019-09-16 DIAGNOSIS — I10 Essential (primary) hypertension: Secondary | ICD-10-CM | POA: Diagnosis not present

## 2019-09-16 DIAGNOSIS — R600 Localized edema: Secondary | ICD-10-CM | POA: Diagnosis not present

## 2019-09-16 DIAGNOSIS — Z0000 Encounter for general adult medical examination without abnormal findings: Secondary | ICD-10-CM | POA: Diagnosis not present

## 2019-09-16 DIAGNOSIS — Z131 Encounter for screening for diabetes mellitus: Secondary | ICD-10-CM | POA: Diagnosis not present

## 2019-09-16 DIAGNOSIS — E7849 Other hyperlipidemia: Secondary | ICD-10-CM | POA: Diagnosis not present

## 2019-09-16 DIAGNOSIS — J4522 Mild intermittent asthma with status asthmaticus: Secondary | ICD-10-CM | POA: Diagnosis not present

## 2019-09-17 DIAGNOSIS — Z0000 Encounter for general adult medical examination without abnormal findings: Secondary | ICD-10-CM | POA: Diagnosis not present

## 2019-09-17 DIAGNOSIS — Z6841 Body Mass Index (BMI) 40.0 and over, adult: Secondary | ICD-10-CM | POA: Diagnosis not present

## 2019-09-17 DIAGNOSIS — E7849 Other hyperlipidemia: Secondary | ICD-10-CM | POA: Diagnosis not present

## 2019-09-17 DIAGNOSIS — Z131 Encounter for screening for diabetes mellitus: Secondary | ICD-10-CM | POA: Diagnosis not present

## 2019-09-17 DIAGNOSIS — Z5181 Encounter for therapeutic drug level monitoring: Secondary | ICD-10-CM | POA: Diagnosis not present

## 2019-09-17 DIAGNOSIS — I10 Essential (primary) hypertension: Secondary | ICD-10-CM | POA: Diagnosis not present

## 2019-09-20 DIAGNOSIS — Z5181 Encounter for therapeutic drug level monitoring: Secondary | ICD-10-CM | POA: Diagnosis not present

## 2019-09-21 DIAGNOSIS — Z5181 Encounter for therapeutic drug level monitoring: Secondary | ICD-10-CM | POA: Diagnosis not present

## 2019-09-22 DIAGNOSIS — Z5181 Encounter for therapeutic drug level monitoring: Secondary | ICD-10-CM | POA: Diagnosis not present

## 2019-09-23 DIAGNOSIS — Z5181 Encounter for therapeutic drug level monitoring: Secondary | ICD-10-CM | POA: Diagnosis not present

## 2019-09-27 DIAGNOSIS — Z5181 Encounter for therapeutic drug level monitoring: Secondary | ICD-10-CM | POA: Diagnosis not present

## 2019-09-28 DIAGNOSIS — Z5181 Encounter for therapeutic drug level monitoring: Secondary | ICD-10-CM | POA: Diagnosis not present

## 2019-09-29 DIAGNOSIS — Z5181 Encounter for therapeutic drug level monitoring: Secondary | ICD-10-CM | POA: Diagnosis not present

## 2019-10-01 DIAGNOSIS — Z5181 Encounter for therapeutic drug level monitoring: Secondary | ICD-10-CM | POA: Diagnosis not present

## 2019-10-04 DIAGNOSIS — Z5181 Encounter for therapeutic drug level monitoring: Secondary | ICD-10-CM | POA: Diagnosis not present

## 2019-10-05 DIAGNOSIS — Z5181 Encounter for therapeutic drug level monitoring: Secondary | ICD-10-CM | POA: Diagnosis not present

## 2019-10-06 DIAGNOSIS — Z5181 Encounter for therapeutic drug level monitoring: Secondary | ICD-10-CM | POA: Diagnosis not present

## 2019-10-11 DIAGNOSIS — Z5181 Encounter for therapeutic drug level monitoring: Secondary | ICD-10-CM | POA: Diagnosis not present

## 2019-10-12 DIAGNOSIS — Z5181 Encounter for therapeutic drug level monitoring: Secondary | ICD-10-CM | POA: Diagnosis not present

## 2019-10-18 DIAGNOSIS — Z5181 Encounter for therapeutic drug level monitoring: Secondary | ICD-10-CM | POA: Diagnosis not present

## 2019-11-10 DIAGNOSIS — Z5181 Encounter for therapeutic drug level monitoring: Secondary | ICD-10-CM | POA: Diagnosis not present

## 2019-11-15 DIAGNOSIS — Z5181 Encounter for therapeutic drug level monitoring: Secondary | ICD-10-CM | POA: Diagnosis not present

## 2019-11-16 DIAGNOSIS — Z5181 Encounter for therapeutic drug level monitoring: Secondary | ICD-10-CM | POA: Diagnosis not present

## 2019-11-19 DIAGNOSIS — Z5181 Encounter for therapeutic drug level monitoring: Secondary | ICD-10-CM | POA: Diagnosis not present

## 2020-01-13 DIAGNOSIS — Z419 Encounter for procedure for purposes other than remedying health state, unspecified: Secondary | ICD-10-CM | POA: Diagnosis not present

## 2020-01-18 DIAGNOSIS — Z5181 Encounter for therapeutic drug level monitoring: Secondary | ICD-10-CM | POA: Diagnosis not present

## 2020-01-19 DIAGNOSIS — Z5181 Encounter for therapeutic drug level monitoring: Secondary | ICD-10-CM | POA: Diagnosis not present

## 2020-01-24 DIAGNOSIS — Z5181 Encounter for therapeutic drug level monitoring: Secondary | ICD-10-CM | POA: Diagnosis not present

## 2020-01-25 DIAGNOSIS — Z5181 Encounter for therapeutic drug level monitoring: Secondary | ICD-10-CM | POA: Diagnosis not present

## 2020-01-26 DIAGNOSIS — Z5181 Encounter for therapeutic drug level monitoring: Secondary | ICD-10-CM | POA: Diagnosis not present

## 2020-01-31 DIAGNOSIS — Z5181 Encounter for therapeutic drug level monitoring: Secondary | ICD-10-CM | POA: Diagnosis not present

## 2020-02-02 DIAGNOSIS — Z5181 Encounter for therapeutic drug level monitoring: Secondary | ICD-10-CM | POA: Diagnosis not present

## 2020-02-03 DIAGNOSIS — Z5181 Encounter for therapeutic drug level monitoring: Secondary | ICD-10-CM | POA: Diagnosis not present

## 2020-02-07 DIAGNOSIS — Z5181 Encounter for therapeutic drug level monitoring: Secondary | ICD-10-CM | POA: Diagnosis not present

## 2020-02-10 DIAGNOSIS — Z5181 Encounter for therapeutic drug level monitoring: Secondary | ICD-10-CM | POA: Diagnosis not present

## 2020-02-13 DIAGNOSIS — Z5181 Encounter for therapeutic drug level monitoring: Secondary | ICD-10-CM | POA: Diagnosis not present

## 2020-02-13 DIAGNOSIS — Z419 Encounter for procedure for purposes other than remedying health state, unspecified: Secondary | ICD-10-CM | POA: Diagnosis not present

## 2020-02-14 DIAGNOSIS — Z5181 Encounter for therapeutic drug level monitoring: Secondary | ICD-10-CM | POA: Diagnosis not present

## 2020-02-16 DIAGNOSIS — Z5181 Encounter for therapeutic drug level monitoring: Secondary | ICD-10-CM | POA: Diagnosis not present

## 2020-02-21 DIAGNOSIS — Z5181 Encounter for therapeutic drug level monitoring: Secondary | ICD-10-CM | POA: Diagnosis not present

## 2020-03-15 DIAGNOSIS — Z419 Encounter for procedure for purposes other than remedying health state, unspecified: Secondary | ICD-10-CM | POA: Diagnosis not present

## 2020-03-15 DIAGNOSIS — Z5181 Encounter for therapeutic drug level monitoring: Secondary | ICD-10-CM | POA: Diagnosis not present

## 2020-03-20 DIAGNOSIS — Z5181 Encounter for therapeutic drug level monitoring: Secondary | ICD-10-CM | POA: Diagnosis not present

## 2020-04-05 DIAGNOSIS — Z5181 Encounter for therapeutic drug level monitoring: Secondary | ICD-10-CM | POA: Diagnosis not present

## 2020-04-14 DIAGNOSIS — Z419 Encounter for procedure for purposes other than remedying health state, unspecified: Secondary | ICD-10-CM | POA: Diagnosis not present

## 2020-04-17 DIAGNOSIS — Z5181 Encounter for therapeutic drug level monitoring: Secondary | ICD-10-CM | POA: Diagnosis not present

## 2020-05-15 DIAGNOSIS — Z419 Encounter for procedure for purposes other than remedying health state, unspecified: Secondary | ICD-10-CM | POA: Diagnosis not present

## 2020-06-14 DIAGNOSIS — Z419 Encounter for procedure for purposes other than remedying health state, unspecified: Secondary | ICD-10-CM | POA: Diagnosis not present

## 2020-07-15 DIAGNOSIS — Z419 Encounter for procedure for purposes other than remedying health state, unspecified: Secondary | ICD-10-CM | POA: Diagnosis not present

## 2020-08-15 DIAGNOSIS — Z419 Encounter for procedure for purposes other than remedying health state, unspecified: Secondary | ICD-10-CM | POA: Diagnosis not present

## 2020-09-12 DIAGNOSIS — Z419 Encounter for procedure for purposes other than remedying health state, unspecified: Secondary | ICD-10-CM | POA: Diagnosis not present

## 2020-10-13 DIAGNOSIS — Z419 Encounter for procedure for purposes other than remedying health state, unspecified: Secondary | ICD-10-CM | POA: Diagnosis not present

## 2020-11-08 ENCOUNTER — Other Ambulatory Visit: Payer: Self-pay | Admitting: Internal Medicine

## 2020-11-08 DIAGNOSIS — Z6841 Body Mass Index (BMI) 40.0 and over, adult: Secondary | ICD-10-CM | POA: Diagnosis not present

## 2020-11-08 DIAGNOSIS — Z0000 Encounter for general adult medical examination without abnormal findings: Secondary | ICD-10-CM | POA: Diagnosis not present

## 2020-11-08 DIAGNOSIS — E559 Vitamin D deficiency, unspecified: Secondary | ICD-10-CM | POA: Diagnosis not present

## 2020-11-08 DIAGNOSIS — I10 Essential (primary) hypertension: Secondary | ICD-10-CM | POA: Diagnosis not present

## 2020-11-08 DIAGNOSIS — Z131 Encounter for screening for diabetes mellitus: Secondary | ICD-10-CM | POA: Diagnosis not present

## 2020-11-08 DIAGNOSIS — E7849 Other hyperlipidemia: Secondary | ICD-10-CM | POA: Diagnosis not present

## 2020-11-09 LAB — COMPLETE METABOLIC PANEL WITH GFR
AG Ratio: 1.4 (calc) (ref 1.0–2.5)
ALT: 13 U/L (ref 6–29)
AST: 16 U/L (ref 10–30)
Albumin: 4.4 g/dL (ref 3.6–5.1)
Alkaline phosphatase (APISO): 51 U/L (ref 31–125)
BUN: 13 mg/dL (ref 7–25)
CO2: 23 mmol/L (ref 20–32)
Calcium: 9.6 mg/dL (ref 8.6–10.2)
Chloride: 103 mmol/L (ref 98–110)
Creat: 0.98 mg/dL (ref 0.50–1.10)
GFR, Est African American: 85 mL/min/{1.73_m2} (ref >=60)
GFR, Est Non African American: 73 mL/min/{1.73_m2} (ref >=60)
Globulin: 3.1 g/dL (calc) (ref 1.9–3.7)
Glucose, Bld: 105 mg/dL — ABNORMAL HIGH (ref 65–99)
Potassium: 3.7 mmol/L (ref 3.5–5.3)
Sodium: 138 mmol/L (ref 135–146)
Total Bilirubin: 0.4 mg/dL (ref 0.2–1.2)
Total Protein: 7.5 g/dL (ref 6.1–8.1)

## 2020-11-09 LAB — LIPID PANEL
Cholesterol: 220 mg/dL — ABNORMAL HIGH (ref <200)
HDL: 76 mg/dL (ref >=50)
LDL Cholesterol (Calc): 118 mg/dL (calc) — ABNORMAL HIGH
Non-HDL Cholesterol (Calc): 144 mg/dL (calc) — ABNORMAL HIGH (ref <130)
Total CHOL/HDL Ratio: 2.9 (calc) (ref <5.0)
Triglycerides: 138 mg/dL (ref <150)

## 2020-11-09 LAB — CBC
HCT: 40.1 % (ref 35.0–45.0)
Hemoglobin: 13.5 g/dL (ref 11.7–15.5)
MCH: 29.9 pg (ref 27.0–33.0)
MCHC: 33.7 g/dL (ref 32.0–36.0)
MCV: 88.9 fL (ref 80.0–100.0)
MPV: 10.9 fL (ref 7.5–12.5)
Platelets: 314 10*3/uL (ref 140–400)
RBC: 4.51 10*6/uL (ref 3.80–5.10)
RDW: 12.8 % (ref 11.0–15.0)
WBC: 6.1 10*3/uL (ref 3.8–10.8)

## 2020-11-09 LAB — TSH: TSH: 1.8 mIU/L

## 2020-11-09 LAB — VITAMIN D 25 HYDROXY (VIT D DEFICIENCY, FRACTURES): Vit D, 25-Hydroxy: 57 ng/mL (ref 30–100)

## 2020-11-09 LAB — CLIENT EDUCATION TRACKING

## 2020-11-12 DIAGNOSIS — Z419 Encounter for procedure for purposes other than remedying health state, unspecified: Secondary | ICD-10-CM | POA: Diagnosis not present

## 2020-12-13 DIAGNOSIS — Z419 Encounter for procedure for purposes other than remedying health state, unspecified: Secondary | ICD-10-CM | POA: Diagnosis not present

## 2021-01-12 DIAGNOSIS — Z419 Encounter for procedure for purposes other than remedying health state, unspecified: Secondary | ICD-10-CM | POA: Diagnosis not present

## 2021-01-22 DIAGNOSIS — Z6841 Body Mass Index (BMI) 40.0 and over, adult: Secondary | ICD-10-CM | POA: Diagnosis not present

## 2021-01-22 DIAGNOSIS — Z124 Encounter for screening for malignant neoplasm of cervix: Secondary | ICD-10-CM | POA: Diagnosis not present

## 2021-01-22 DIAGNOSIS — I10 Essential (primary) hypertension: Secondary | ICD-10-CM | POA: Diagnosis not present

## 2021-01-22 DIAGNOSIS — J4522 Mild intermittent asthma with status asthmaticus: Secondary | ICD-10-CM | POA: Diagnosis not present

## 2021-01-22 DIAGNOSIS — F419 Anxiety disorder, unspecified: Secondary | ICD-10-CM | POA: Diagnosis not present

## 2021-02-12 DIAGNOSIS — Z419 Encounter for procedure for purposes other than remedying health state, unspecified: Secondary | ICD-10-CM | POA: Diagnosis not present

## 2021-03-15 DIAGNOSIS — Z419 Encounter for procedure for purposes other than remedying health state, unspecified: Secondary | ICD-10-CM | POA: Diagnosis not present

## 2021-04-14 DIAGNOSIS — Z419 Encounter for procedure for purposes other than remedying health state, unspecified: Secondary | ICD-10-CM | POA: Diagnosis not present

## 2021-05-15 DIAGNOSIS — Z419 Encounter for procedure for purposes other than remedying health state, unspecified: Secondary | ICD-10-CM | POA: Diagnosis not present

## 2021-06-14 DIAGNOSIS — Z419 Encounter for procedure for purposes other than remedying health state, unspecified: Secondary | ICD-10-CM | POA: Diagnosis not present

## 2021-07-15 DIAGNOSIS — Z419 Encounter for procedure for purposes other than remedying health state, unspecified: Secondary | ICD-10-CM | POA: Diagnosis not present

## 2021-08-15 DIAGNOSIS — Z419 Encounter for procedure for purposes other than remedying health state, unspecified: Secondary | ICD-10-CM | POA: Diagnosis not present

## 2021-09-12 DIAGNOSIS — Z419 Encounter for procedure for purposes other than remedying health state, unspecified: Secondary | ICD-10-CM | POA: Diagnosis not present

## 2021-09-17 DIAGNOSIS — Z20822 Contact with and (suspected) exposure to covid-19: Secondary | ICD-10-CM | POA: Diagnosis not present

## 2021-09-18 DIAGNOSIS — Z20822 Contact with and (suspected) exposure to covid-19: Secondary | ICD-10-CM | POA: Diagnosis not present

## 2021-09-19 DIAGNOSIS — Z20822 Contact with and (suspected) exposure to covid-19: Secondary | ICD-10-CM | POA: Diagnosis not present

## 2021-09-20 DIAGNOSIS — Z20822 Contact with and (suspected) exposure to covid-19: Secondary | ICD-10-CM | POA: Diagnosis not present

## 2021-10-13 DIAGNOSIS — Z419 Encounter for procedure for purposes other than remedying health state, unspecified: Secondary | ICD-10-CM | POA: Diagnosis not present

## 2021-11-12 DIAGNOSIS — Z419 Encounter for procedure for purposes other than remedying health state, unspecified: Secondary | ICD-10-CM | POA: Diagnosis not present

## 2021-12-13 DIAGNOSIS — Z419 Encounter for procedure for purposes other than remedying health state, unspecified: Secondary | ICD-10-CM | POA: Diagnosis not present

## 2022-01-12 DIAGNOSIS — Z419 Encounter for procedure for purposes other than remedying health state, unspecified: Secondary | ICD-10-CM | POA: Diagnosis not present

## 2022-02-11 ENCOUNTER — Emergency Department (HOSPITAL_BASED_OUTPATIENT_CLINIC_OR_DEPARTMENT_OTHER)
Admission: EM | Admit: 2022-02-11 | Discharge: 2022-02-11 | Disposition: A | Discharge status: Home / Self Care | Payer: Medicaid Other | Attending: Emergency Medicine | Admitting: Emergency Medicine

## 2022-02-11 ENCOUNTER — Other Ambulatory Visit: Payer: Self-pay

## 2022-02-11 ENCOUNTER — Encounter (HOSPITAL_BASED_OUTPATIENT_CLINIC_OR_DEPARTMENT_OTHER): Payer: Self-pay | Admitting: Emergency Medicine

## 2022-02-11 DIAGNOSIS — I10 Essential (primary) hypertension: Secondary | ICD-10-CM | POA: Diagnosis not present

## 2022-02-11 DIAGNOSIS — Z760 Encounter for issue of repeat prescription: Secondary | ICD-10-CM | POA: Diagnosis not present

## 2022-02-11 MED ORDER — LOSARTAN POTASSIUM 50 MG PO TABS: 50.0000 mg | ORAL_TABLET | Freq: Every day | ORAL | 1 refills | Status: DC

## 2022-02-11 NOTE — ED Provider Notes (Signed)
[facility] EMERGENCY DEPT Provider Note   CSN: [id number] Arrival date & time: 02/11/22  1216     History  Chief Complaint  Patient presents with   Hypertension    Terry Osborn is a 41 y.o. female.  Patient with history of hypertension, has been on losartan 50 mg daily "for years" presents after running out of her blood pressure medication.  She has been off of her blood pressure medication for about a week.  She reports some mildly blurry vision but no loss of vision.  She states that she feels anxious when she does not have her blood pressure medication.  She has also noted a small amount of swelling in her posterior left ankle that seems to be worse.  Denies injury.  States that she is in the process of getting a follow-up appointment with her PCP so that she can get her medications refilled.       Home Medications Prior to Admission medications   Medication Sig Start Date End Date Taking? Authorizing Provider  losartan (COZAAR) 50 MG tablet Take 1 tablet (50 mg total) by mouth daily. 02/11/22  Yes Renne Crigler, PA-C  Aspirin-Acetaminophen-Caffeine (GOODY HEADACHE PO) Take 1 packet by mouth daily as needed (headache).    [provider]  hydrOXYzine (VISTARIL) 25 MG capsule Take 25 mg by mouth daily as needed for anxiety.    [provider]      Allergies    Patient has no known allergies.    Review of Systems   Review of Systems  Physical Exam Updated Vital Signs BP (!) 157/75   Pulse 90   Temp 98.4 F (36.9 C) (Oral)   Resp 18   SpO2 100%  Physical Exam Vitals and nursing note reviewed.  Constitutional:      Appearance: She is well-developed.  HENT:     Head: Normocephalic and atraumatic.  Eyes:     Conjunctiva/sclera: Conjunctivae normal.  Cardiovascular:     Rate and Rhythm: Normal rate.     Heart sounds: No murmur heard. Pulmonary:     Effort: No respiratory distress.  Musculoskeletal:     Cervical back: Normal  range of motion and neck supple.     Right lower leg: No edema.     Comments: Minimal edema to the posterior lateral left ankle lateral to the Achilles tendon.  No signs of DVT.  No pitting edema of the lower extremities.  Skin:    General: Skin is warm and dry.  Neurological:     Mental Status: She is alert.     ED Results / Procedures / Treatments   Labs (all labs ordered are listed, but only abnormal results are displayed) Labs Reviewed - No data to display  EKG None  Radiology No results found.  Procedures Procedures    Medications Ordered in ED Medications - No data to display  ED Course/ Medical Decision Making/ A&P   Patient seen and examined. History obtained directly from patient.   Labs/EKG: None ordered  Imaging: None ordered  Medications/Fluids: None ordered  Most recent vital signs reviewed and are as follows: BP (!) 157/75   Pulse 90   Temp 98.4 F (36.9 C) (Oral)   Resp 18   SpO2 100%   Initial impression: Hypertension, out of medications, no signs of hypertensive emergency.  Home treatment plan: Restart BP meds  Return instructions discussed with patient: Worsening symptoms or other concerns  Follow-up instructions discussed with patient: Charged follow-up with PCP  in the next 1 to 2 weeks for recheck of blood pressure and to talk about medication refills.                          Medical Decision Making Risk Prescription drug management.   Patient here for evaluation of elevated blood pressure in setting of recently running out of her medication.  Will restart.  Patient does not have any vision loss, signs of stroke, signs of edema, shortness of breath, dissection.  No endorgan damage suspected at this time.        Final Clinical Impression(s) / ED Diagnoses Final diagnoses:  Primary hypertension  Medication refill    Rx / DC Orders ED Discharge Orders          Ordered    losartan (COZAAR) 50 MG tablet  Daily         02/11/22 1449              Renne Crigler, PA-C 02/11/22 1453    Glynn Octave, MD 02/11/22 1559

## 2022-02-11 NOTE — ED Notes (Signed)
Patient verbalizes understanding of discharge instructions. Opportunity for questioning and answers were provided. Patient discharged from ED.  °

## 2022-02-11 NOTE — ED Triage Notes (Signed)
Pt her e from home with c/o htn , been out of meds for about 1 week , pt was on 50mg  losartan

## 2022-02-11 NOTE — Discharge Instructions (Signed)
Please read and follow all provided instructions.  Your diagnoses today include:  1. Primary hypertension   2. Medication refill     Your blood pressure was high today (BP): BP (!) 157/75   Pulse 90   Temp 98.4 F (36.9 C) (Oral)   Resp 18   SpO2 100%   Tests performed today include: Vital signs. See below for your results today.   Medications prescribed:  Losartan 50mg  for high blood pressure  Home care instructions:  Follow any educational materials contained in this packet.  Follow-up instructions: Please follow-up with your primary care provider in the next 7 days for a recheck of your symptoms and blood pressure.    Return instructions:  Please return to the Emergency Department if you experience worsening symptoms.  Return with severe chest pain, abdominal pain, or shortness of breath.  Return with severe headache, focal weakness, numbness, difficulty with speech or vision. Return with loss of vision or sudden vision change. Please return if you have any other emergent concerns.  Additional Information:  Your vital signs today were: BP (!) 157/75   Pulse 90   Temp 98.4 F (36.9 C) (Oral)   Resp 18   SpO2 100%  If your blood pressure (BP) was elevated above 135/85 this visit, please have this repeated by your doctor within one month. --------------

## 2022-02-12 ENCOUNTER — Telehealth: Payer: Self-pay

## 2022-02-12 DIAGNOSIS — Z419 Encounter for procedure for purposes other than remedying health state, unspecified: Secondary | ICD-10-CM | POA: Diagnosis not present

## 2022-02-12 NOTE — Telephone Encounter (Signed)
Transition Care Management Follow-up Telephone Call Date of discharge and from where: 02/11/2022 from [facility] How have you been since you were released from the hospital? Patient states that she is feeling better.  Any questions or concerns? No  Items Reviewed: Did the pt receive and understand the discharge instructions provided? Yes  Medications obtained and verified? Yes  Other? No  Any new allergies since your discharge? No  Dietary orders reviewed? No Do you have support at home? Yes   Functional Questionnaire: (I = Independent and D = Dependent) ADLs: I  Bathing/Dressing- I  Meal Prep- I  Eating- I  Maintaining continence- I  Transferring/Ambulation- I  Managing Meds- I   Follow up appointments reviewed:  PCP Hospital f/u appt confirmed? No  Encourage patient to call PCP (Not [facility] Provider).  Specialist Hospital f/u appt confirmed? No   Are transportation arrangements needed? No  If their condition worsens, is the pt aware to call PCP or go to the Emergency Dept.? Yes Was the patient provided with contact information for the PCP's office or ED? Yes Was to pt encouraged to call back with questions or concerns? Yes

## 2022-03-14 ENCOUNTER — Other Ambulatory Visit: Payer: Self-pay | Admitting: Internal Medicine

## 2022-03-14 DIAGNOSIS — E559 Vitamin D deficiency, unspecified: Secondary | ICD-10-CM | POA: Diagnosis not present

## 2022-03-14 DIAGNOSIS — Z131 Encounter for screening for diabetes mellitus: Secondary | ICD-10-CM | POA: Diagnosis not present

## 2022-03-14 DIAGNOSIS — J4522 Mild intermittent asthma with status asthmaticus: Secondary | ICD-10-CM | POA: Diagnosis not present

## 2022-03-14 DIAGNOSIS — Z1239 Encounter for other screening for malignant neoplasm of breast: Secondary | ICD-10-CM | POA: Diagnosis not present

## 2022-03-14 DIAGNOSIS — F419 Anxiety disorder, unspecified: Secondary | ICD-10-CM | POA: Diagnosis not present

## 2022-03-14 DIAGNOSIS — I10 Essential (primary) hypertension: Secondary | ICD-10-CM | POA: Diagnosis not present

## 2022-03-14 DIAGNOSIS — E7849 Other hyperlipidemia: Secondary | ICD-10-CM | POA: Diagnosis not present

## 2022-03-14 DIAGNOSIS — Z23 Encounter for immunization: Secondary | ICD-10-CM | POA: Diagnosis not present

## 2022-03-14 DIAGNOSIS — Z0000 Encounter for general adult medical examination without abnormal findings: Secondary | ICD-10-CM | POA: Diagnosis not present

## 2022-03-14 DIAGNOSIS — Z6841 Body Mass Index (BMI) 40.0 and over, adult: Secondary | ICD-10-CM | POA: Diagnosis not present

## 2022-03-15 DIAGNOSIS — Z419 Encounter for procedure for purposes other than remedying health state, unspecified: Secondary | ICD-10-CM | POA: Diagnosis not present

## 2022-03-15 LAB — COMPLETE METABOLIC PANEL WITH GFR
AG Ratio: 1.3 (calc) (ref 1.0–2.5)
ALT: 13 U/L (ref 6–29)
AST: 14 U/L (ref 10–30)
Albumin: 4.2 g/dL (ref 3.6–5.1)
Alkaline phosphatase (APISO): 60 U/L (ref 31–125)
BUN/Creatinine Ratio: 10 (calc) (ref 6–22)
BUN: 10 mg/dL (ref 7–25)
CO2: 22 mmol/L (ref 20–32)
Calcium: 9.3 mg/dL (ref 8.6–10.2)
Chloride: 103 mmol/L (ref 98–110)
Creat: 1 mg/dL — ABNORMAL HIGH (ref 0.50–0.99)
Globulin: 3.2 g/dL (calc) (ref 1.9–3.7)
Glucose, Bld: 86 mg/dL (ref 65–99)
Potassium: 3.9 mmol/L (ref 3.5–5.3)
Sodium: 139 mmol/L (ref 135–146)
Total Bilirubin: 0.3 mg/dL (ref 0.2–1.2)
Total Protein: 7.4 g/dL (ref 6.1–8.1)
eGFR: 73 mL/min/{1.73_m2} (ref >=60)

## 2022-03-15 LAB — LIPID PANEL
Cholesterol: 216 mg/dL — ABNORMAL HIGH (ref <200)
HDL: 73 mg/dL (ref >=50)
LDL Cholesterol (Calc): 121 mg/dL (calc) — ABNORMAL HIGH
Non-HDL Cholesterol (Calc): 143 mg/dL (calc) — ABNORMAL HIGH (ref <130)
Total CHOL/HDL Ratio: 3 (calc) (ref <5.0)
Triglycerides: 116 mg/dL (ref <150)

## 2022-03-15 LAB — SPECIMEN COMPROMISED

## 2022-03-15 LAB — CBC
HCT: 40.3 % (ref 35.0–45.0)
Hemoglobin: 14 g/dL (ref 11.7–15.5)
MCH: 31.5 pg (ref 27.0–33.0)
MCHC: 34.7 g/dL (ref 32.0–36.0)
MCV: 90.6 fL (ref 80.0–100.0)
MPV: 10.3 fL (ref 7.5–12.5)
Platelets: 379 10*3/uL (ref 140–400)
RBC: 4.45 10*6/uL (ref 3.80–5.10)
RDW: 12.4 % (ref 11.0–15.0)
WBC: 6.7 10*3/uL (ref 3.8–10.8)

## 2022-03-15 LAB — TSH: TSH: 1.54 mIU/L

## 2022-03-15 LAB — VITAMIN D 25 HYDROXY (VIT D DEFICIENCY, FRACTURES): Vit D, 25-Hydroxy: 29 ng/mL — ABNORMAL LOW (ref 30–100)

## 2022-04-14 DIAGNOSIS — Z419 Encounter for procedure for purposes other than remedying health state, unspecified: Secondary | ICD-10-CM | POA: Diagnosis not present

## 2022-05-15 DIAGNOSIS — Z419 Encounter for procedure for purposes other than remedying health state, unspecified: Secondary | ICD-10-CM | POA: Diagnosis not present

## 2022-06-14 DIAGNOSIS — Z419 Encounter for procedure for purposes other than remedying health state, unspecified: Secondary | ICD-10-CM | POA: Diagnosis not present

## 2022-07-15 DIAGNOSIS — Z419 Encounter for procedure for purposes other than remedying health state, unspecified: Secondary | ICD-10-CM | POA: Diagnosis not present

## 2022-08-15 DIAGNOSIS — Z419 Encounter for procedure for purposes other than remedying health state, unspecified: Secondary | ICD-10-CM | POA: Diagnosis not present

## 2022-09-13 DIAGNOSIS — Z419 Encounter for procedure for purposes other than remedying health state, unspecified: Secondary | ICD-10-CM | POA: Diagnosis not present

## 2022-10-14 DIAGNOSIS — Z419 Encounter for procedure for purposes other than remedying health state, unspecified: Secondary | ICD-10-CM | POA: Diagnosis not present

## 2022-11-13 DIAGNOSIS — Z419 Encounter for procedure for purposes other than remedying health state, unspecified: Secondary | ICD-10-CM | POA: Diagnosis not present

## 2022-12-14 DIAGNOSIS — Z419 Encounter for procedure for purposes other than remedying health state, unspecified: Secondary | ICD-10-CM | POA: Diagnosis not present

## 2023-01-13 DIAGNOSIS — Z419 Encounter for procedure for purposes other than remedying health state, unspecified: Secondary | ICD-10-CM | POA: Diagnosis not present

## 2023-01-15 ENCOUNTER — Other Ambulatory Visit: Payer: Self-pay | Admitting: Internal Medicine

## 2023-01-15 DIAGNOSIS — Z1231 Encounter for screening mammogram for malignant neoplasm of breast: Secondary | ICD-10-CM

## 2023-01-20 ENCOUNTER — Ambulatory Visit
Admission: RE | Admit: 2023-01-20 | Discharge: 2023-01-20 | Disposition: A | Discharge status: Home / Self Care | Payer: Medicaid Other | Source: Ambulatory Visit | Attending: Internal Medicine | Admitting: Internal Medicine

## 2023-01-20 DIAGNOSIS — Z1231 Encounter for screening mammogram for malignant neoplasm of breast: Secondary | ICD-10-CM | POA: Diagnosis not present

## 2023-02-13 DIAGNOSIS — Z419 Encounter for procedure for purposes other than remedying health state, unspecified: Secondary | ICD-10-CM | POA: Diagnosis not present

## 2023-03-12 ENCOUNTER — Encounter: Payer: Self-pay | Admitting: Family Medicine

## 2023-03-12 ENCOUNTER — Ambulatory Visit (INDEPENDENT_AMBULATORY_CARE_PROVIDER_SITE_OTHER): Payer: Medicaid Other | Admitting: Family Medicine

## 2023-03-12 VITALS — BP 124/86 | HR 76 | Temp 98.9°F | Ht 63.0 in | Wt 271.4 lb

## 2023-03-12 DIAGNOSIS — Z1159 Encounter for screening for other viral diseases: Secondary | ICD-10-CM

## 2023-03-12 DIAGNOSIS — Z6841 Body Mass Index (BMI) 40.0 and over, adult: Secondary | ICD-10-CM

## 2023-03-12 DIAGNOSIS — I10 Essential (primary) hypertension: Secondary | ICD-10-CM

## 2023-03-12 DIAGNOSIS — Z7689 Persons encountering health services in other specified circumstances: Secondary | ICD-10-CM

## 2023-03-12 DIAGNOSIS — E785 Hyperlipidemia, unspecified: Secondary | ICD-10-CM

## 2023-03-12 DIAGNOSIS — Z23 Encounter for immunization: Secondary | ICD-10-CM

## 2023-03-12 DIAGNOSIS — Z114 Encounter for screening for human immunodeficiency virus [HIV]: Secondary | ICD-10-CM

## 2023-03-12 LAB — POCT URINALYSIS DIPSTICK
Bilirubin, UA: NEGATIVE
Blood, UA: NEGATIVE
Glucose, UA: NEGATIVE
Ketones, UA: NEGATIVE
Nitrite, UA: NEGATIVE
Protein, UA: NEGATIVE
Spec Grav, UA: 1.015 (ref 1.010–1.025)
Urobilinogen, UA: 0.2 E.U./dL
pH, UA: 7 (ref 5.0–8.0)

## 2023-03-12 MED ORDER — LOSARTAN POTASSIUM-HCTZ 50-12.5 MG PO TABS
1.0000 | ORAL_TABLET | Freq: Every day | ORAL | 1 refills | Status: DC
Start: 2023-03-12 — End: 2023-07-14

## 2023-03-12 MED ORDER — WEGOVY 0.25 MG/0.5ML ~~LOC~~ SOAJ
0.2500 mg | SUBCUTANEOUS | 0 refills | Status: DC
  Filled 2023-03-20 – 2023-03-21 (×2): qty 2, 28d supply, fill #0

## 2023-03-12 NOTE — Progress Notes (Signed)
I,Jameka J Llittleton, CMA,acting as a Neurosurgeon for [employer], NP.,have documented all relevant documentation on the behalf of Ellender Hose, NP,as directed by  Ellender Hose, NP while in the presence of Ellender Hose, NP.  Subjective:  Patient ID: Terry Osborn , female    DOB: 1981-01-01 , 42 y.o.   MRN: [id number]  Chief Complaint  Patient presents with   Establish Care   Hypertension    HPI  Patient presents today to establish primary care. Patient would like to be seen for her hypertension. Patient reports has not seen a PCP in about 1 year     Past Medical History:  Diagnosis Date   Anxiety    Asthma    Hypertension      Family History  Problem Relation Age of Onset   Diabetes Mother    Hypertension Mother      Current Outpatient Medications:    hydrOXYzine (VISTARIL) 25 MG capsule, Take 25 mg by mouth daily as needed for anxiety., Disp: , Rfl:    phentermine 37.5 MG capsule, Take 37.5 mg by mouth every morning., Disp: , Rfl:    Semaglutide-Weight Management (WEGOVY) 0.25 MG/0.5ML SOAJ, Inject 0.25 mg into the skin every 7 (seven) days., Disp: 2 mL, Rfl: 0   losartan-hydrochlorothiazide (HYZAAR) 50-12.5 MG tablet, Take 1 tablet by mouth daily., Disp: 90 tablet, Rfl: 1   No Known Allergies   Review of Systems  Constitutional: Negative.   Respiratory: Negative.    Cardiovascular: Negative.  Negative for chest pain, palpitations and leg swelling.  Genitourinary: Negative.   Neurological: Negative.   Psychiatric/Behavioral: Negative.       Today's Vitals   03/12/23 1041  Temp: 98.9 F (37.2 C)  Weight: 271 lb 6.4 oz (123.1 kg)  Height: 5\' 3"  (1.6 m)  PainSc: 0-No pain   Body mass index is 48.08 kg/m.  Wt Readings from Last 3 Encounters:  03/12/23 271 lb 6.4 oz (123.1 kg)      Objective:  Physical Exam Constitutional:      Appearance: She is obese.  HENT:     Head: Normocephalic.  Cardiovascular:     Rate and Rhythm: Normal rate and regular rhythm.   Pulmonary:     Effort: Pulmonary effort is normal.     Breath sounds: Normal breath sounds.  Abdominal:     General: Bowel sounds are normal.  Skin:    General: Skin is warm and dry.  Neurological:     General: No focal deficit present.     Mental Status: She is alert and oriented to person, place, and time.  Psychiatric:        Behavior: Behavior normal.         Assessment And Plan:  Primary hypertension Assessment & Plan: Low salt diet advised   Orders: -     POCT urinalysis dipstick -     Microalbumin / creatinine urine ratio -     EKG 12-Lead -     Losartan Potassium-HCTZ; Take 1 tablet by mouth daily.  Dispense: 90 tablet; Refill: 1  Establishing care with new doctor, encounter for  Immunization due -     Flu vaccine trivalent PF, 6mos and older(Flulaval,Afluria,Fluarix,Fluzone)  Hyperlipidemia LDL goal <100 -     CMP14+EGFR -     CBC -     Lipid panel  Class 3 severe obesity due to excess calories with body mass index (BMI) of 45.0 to 49.9 in adult, unspecified whether serious comorbidity present [facility]) Assessment &  Plan: She is encouraged to strive for BMI less than 30 to decrease cardiac risk. Advised to aim for at least 150 minutes of exercise per week.   Orders: -     Wegovy; Inject 0.25 mg into the skin every 7 (seven) days.  Dispense: 2 mL; Refill: 0    Return in about 3 months (around 06/12/2023) for physical.  Patient was given opportunity to ask questions. Patient verbalized understanding of the plan and was able to repeat key elements of the plan. All questions were answered to their satisfaction.    I, Ellender Hose, NP, have reviewed all documentation for this visit. The documentation on 03/18/23 for the exam, diagnosis, procedures, and orders are all accurate and complete.   IF YOU HAVE BEEN REFERRED TO A SPECIALIST, IT MAY TAKE 1-2 WEEKS TO SCHEDULE/PROCESS THE REFERRAL. IF YOU HAVE NOT HEARD FROM US/SPECIALIST IN TWO WEEKS, PLEASE GIVE [place] A  CALL AT [phone] X [phone].

## 2023-03-13 LAB — CBC
Hematocrit: 40.9 % (ref 34.0–46.6)
Hemoglobin: 13.8 g/dL (ref 11.1–15.9)
MCH: 29.5 pg (ref 26.6–33.0)
MCHC: 33.7 g/dL (ref 31.5–35.7)
MCV: 87 fL (ref 79–97)
Platelets: 364 10*3/uL (ref 150–450)
RBC: 4.68 x10E6/uL (ref 3.77–5.28)
RDW: 12.8 % (ref 11.7–15.4)
WBC: 6.9 10*3/uL (ref 3.4–10.8)

## 2023-03-13 LAB — LIPID PANEL
Chol/HDL Ratio: 3.6 ratio (ref 0.0–4.4)
Cholesterol, Total: 239 mg/dL — ABNORMAL HIGH (ref 100–199)
HDL: 67 mg/dL (ref >39)
LDL Chol Calc (NIH): 155 mg/dL — ABNORMAL HIGH (ref 0–99)
Triglycerides: 95 mg/dL (ref 0–149)
VLDL Cholesterol Cal: 17 mg/dL (ref 5–40)

## 2023-03-13 LAB — CMP14+EGFR
ALT: 16 IU/L (ref 0–32)
AST: 19 IU/L (ref 0–40)
Albumin: 4.4 g/dL (ref 3.9–4.9)
Alkaline Phosphatase: 73 IU/L (ref 44–121)
BUN/Creatinine Ratio: 14 (ref 9–23)
BUN: 13 mg/dL (ref 6–24)
Bilirubin Total: 0.3 mg/dL (ref 0.0–1.2)
CO2: 25 mmol/L (ref 20–29)
Calcium: 9.5 mg/dL (ref 8.7–10.2)
Chloride: 97 mmol/L (ref 96–106)
Creatinine, Ser: 0.96 mg/dL (ref 0.57–1.00)
Globulin, Total: 3 g/dL (ref 1.5–4.5)
Glucose: 96 mg/dL (ref 70–99)
Potassium: 3.4 mmol/L — ABNORMAL LOW (ref 3.5–5.2)
Sodium: 138 mmol/L (ref 134–144)
Total Protein: 7.4 g/dL (ref 6.0–8.5)
eGFR: 76 mL/min/{1.73_m2} (ref >59)

## 2023-03-13 LAB — MICROALBUMIN / CREATININE URINE RATIO
Creatinine, Urine: 99.4 mg/dL
Microalb/Creat Ratio: <3 mg/g{creat} (ref 0–29)
Microalbumin, Urine: <3 ug/mL

## 2023-03-16 DIAGNOSIS — Z419 Encounter for procedure for purposes other than remedying health state, unspecified: Secondary | ICD-10-CM | POA: Diagnosis not present

## 2023-03-18 DIAGNOSIS — I10 Essential (primary) hypertension: Secondary | ICD-10-CM | POA: Insufficient documentation

## 2023-03-18 DIAGNOSIS — Z23 Encounter for immunization: Secondary | ICD-10-CM | POA: Insufficient documentation

## 2023-03-18 DIAGNOSIS — E785 Hyperlipidemia, unspecified: Secondary | ICD-10-CM | POA: Insufficient documentation

## 2023-03-18 DIAGNOSIS — E66813 Obesity, class 3: Secondary | ICD-10-CM | POA: Insufficient documentation

## 2023-03-18 NOTE — Assessment & Plan Note (Signed)
She is encouraged to strive for BMI less than 30 to decrease cardiac risk. Advised to aim for at least 150 minutes of exercise per week.  

## 2023-03-18 NOTE — Assessment & Plan Note (Signed)
Low salt diet advised

## 2023-03-20 ENCOUNTER — Encounter: Payer: Self-pay | Admitting: Family Medicine

## 2023-03-20 ENCOUNTER — Other Ambulatory Visit (HOSPITAL_COMMUNITY): Payer: Self-pay

## 2023-03-20 ENCOUNTER — Telehealth: Payer: Self-pay

## 2023-03-20 NOTE — Telephone Encounter (Signed)
error 

## 2023-03-21 ENCOUNTER — Other Ambulatory Visit: Payer: Self-pay

## 2023-04-01 ENCOUNTER — Other Ambulatory Visit: Payer: Self-pay

## 2023-04-01 ENCOUNTER — Encounter: Payer: Self-pay | Admitting: Family Medicine

## 2023-04-01 MED ORDER — HYDROXYZINE PAMOATE 25 MG PO CAPS: 25.0000 mg | ORAL_CAPSULE | Freq: Every day | ORAL | 1 refills | Status: DC | PRN

## 2023-04-15 DIAGNOSIS — Z419 Encounter for procedure for purposes other than remedying health state, unspecified: Secondary | ICD-10-CM | POA: Diagnosis not present

## 2023-04-17 ENCOUNTER — Other Ambulatory Visit: Payer: Self-pay | Admitting: Family Medicine

## 2023-04-18 ENCOUNTER — Other Ambulatory Visit (HOSPITAL_COMMUNITY): Payer: Self-pay

## 2023-04-18 MED ORDER — WEGOVY 0.25 MG/0.5ML ~~LOC~~ SOAJ
0.2500 mg | SUBCUTANEOUS | 0 refills | Status: DC
Start: 2023-04-18 — End: 2023-05-15
  Filled 2023-04-18 (×2): qty 2, 28d supply, fill #0

## 2023-04-21 ENCOUNTER — Encounter: Payer: Self-pay | Admitting: Family Medicine

## 2023-04-21 ENCOUNTER — Other Ambulatory Visit (HOSPITAL_COMMUNITY): Payer: Self-pay

## 2023-04-22 ENCOUNTER — Other Ambulatory Visit (HOSPITAL_COMMUNITY): Payer: Self-pay

## 2023-04-24 ENCOUNTER — Other Ambulatory Visit: Payer: Self-pay | Admitting: Family Medicine

## 2023-05-15 ENCOUNTER — Other Ambulatory Visit: Payer: Self-pay | Admitting: Family Medicine

## 2023-05-15 DIAGNOSIS — E66813 Obesity, class 3: Secondary | ICD-10-CM

## 2023-05-16 ENCOUNTER — Other Ambulatory Visit (HOSPITAL_COMMUNITY): Payer: Self-pay

## 2023-05-16 DIAGNOSIS — Z419 Encounter for procedure for purposes other than remedying health state, unspecified: Secondary | ICD-10-CM | POA: Diagnosis not present

## 2023-05-16 MED ORDER — WEGOVY 0.25 MG/0.5ML ~~LOC~~ SOAJ
0.2500 mg | SUBCUTANEOUS | 0 refills | Status: DC
  Filled 2023-05-16: qty 2, 28d supply, fill #0

## 2023-05-30 ENCOUNTER — Other Ambulatory Visit: Payer: Self-pay | Admitting: Family Medicine

## 2023-05-30 ENCOUNTER — Ambulatory Visit: Payer: Self-pay | Admitting: Family Medicine

## 2023-06-04 ENCOUNTER — Other Ambulatory Visit (HOSPITAL_BASED_OUTPATIENT_CLINIC_OR_DEPARTMENT_OTHER): Payer: Self-pay

## 2023-06-04 ENCOUNTER — Other Ambulatory Visit: Payer: Self-pay | Admitting: Family Medicine

## 2023-06-04 ENCOUNTER — Ambulatory Visit: Payer: Medicaid Other | Admitting: Family Medicine

## 2023-06-04 ENCOUNTER — Encounter: Payer: Self-pay | Admitting: Family Medicine

## 2023-06-04 VITALS — BP 132/90 | HR 98 | Temp 99.2°F | Ht 63.0 in | Wt 257.0 lb

## 2023-06-04 DIAGNOSIS — E559 Vitamin D deficiency, unspecified: Secondary | ICD-10-CM | POA: Diagnosis not present

## 2023-06-04 DIAGNOSIS — Z0000 Encounter for general adult medical examination without abnormal findings: Secondary | ICD-10-CM | POA: Diagnosis not present

## 2023-06-04 DIAGNOSIS — Z1231 Encounter for screening mammogram for malignant neoplasm of breast: Secondary | ICD-10-CM

## 2023-06-04 DIAGNOSIS — Z23 Encounter for immunization: Secondary | ICD-10-CM | POA: Diagnosis not present

## 2023-06-04 DIAGNOSIS — I10 Essential (primary) hypertension: Secondary | ICD-10-CM | POA: Diagnosis not present

## 2023-06-04 DIAGNOSIS — F419 Anxiety disorder, unspecified: Secondary | ICD-10-CM

## 2023-06-04 DIAGNOSIS — E66813 Obesity, class 3: Secondary | ICD-10-CM

## 2023-06-04 DIAGNOSIS — Z6841 Body Mass Index (BMI) 40.0 and over, adult: Secondary | ICD-10-CM

## 2023-06-04 MED ORDER — WEGOVY 0.5 MG/0.5ML ~~LOC~~ SOAJ
0.5000 mg | SUBCUTANEOUS | 1 refills | Status: DC
  Filled 2023-06-04: qty 2, 28d supply, fill #0

## 2023-06-04 MED ORDER — WEGOVY 0.5 MG/0.5ML ~~LOC~~ SOAJ: 0.5000 mg | SUBCUTANEOUS | 1 refills | Status: DC

## 2023-06-04 NOTE — Progress Notes (Signed)
I,Jameka J Llittleton, CMA,acting as a Neurosurgeon for [employer], NP.,have documented all relevant documentation on the behalf of Ellender Hose, NP,as directed by  Ellender Hose, NP while in the presence of Ellender Hose, NP.  Subjective:  Patient ID: Terry Osborn , female    DOB: 04-16-1981 , 42 y.o.   MRN: [id number]  Chief Complaint  Patient presents with   Weight Check    HPI  Patient is a 42 year old female who is here for her annual physical visit. She is on Wegovy 0.25 mg SQ weekly  for Obesity, she has used it for the past two months effectively curbing her appetite and she has lost about 15 lbs so far, but now she says it is no longer curbing her appetite. Patient has a diagnosis of hypertension and is currently on Losartan /hydrochlorothiazide 50/12.5 mg every day, BP not within limits today, she says she has not had her medication yet.   Patient states that hydroxyzine 25 mg as needed helps her anxiety symptoms and gives her relief, will be refilling her medication.     Past Medical History:  Diagnosis Date   Anxiety    Asthma    Hypertension      Family History  Problem Relation Age of Onset   Diabetes Mother    Hypertension Mother      Current Outpatient Medications:    hydrOXYzine (VISTARIL) 25 MG capsule, TAKE 1 CAPSULE (25 MG TOTAL) BY MOUTH DAILY AS NEEDED FOR ANXIETY., Disp: 90 capsule, Rfl: 1   losartan-hydrochlorothiazide (HYZAAR) 50-12.5 MG tablet, Take 1 tablet by mouth daily., Disp: 90 tablet, Rfl: 1   Semaglutide-Weight Management (WEGOVY) 0.5 MG/0.5ML SOAJ, Inject 0.5 mg into the skin every 7 (seven) days., Disp: 2 mL, Rfl: 1   No Known Allergies   Review of Systems  Constitutional: Negative.   HENT: Negative.    Eyes: Negative.   Respiratory: Negative.    Cardiovascular: Negative.   Gastrointestinal: Negative.   Endocrine: Negative.   Genitourinary: Negative.   Musculoskeletal: Negative.   Skin: Negative.   Allergic/Immunologic: Negative.    Neurological: Negative.   Hematological: Negative.   Psychiatric/Behavioral:  The patient is nervous/anxious.      Today's Vitals   06/04/23 1042 06/04/23 1120  BP: (!) 140/100 (!) 132/90  Pulse: 98   Temp: 99.2 F (37.3 C)   Weight: 257 lb (116.6 kg)   Height: 5\' 3"  (1.6 m)   PainSc: 0-No pain    Body mass index is 45.53 kg/m.  Wt Readings from Last 3 Encounters:  06/04/23 257 lb (116.6 kg)  03/12/23 271 lb 6.4 oz (123.1 kg)    The 10-year ASCVD risk score (Arnett DK, et al., 2019) is: 1.4%   Values used to calculate the score:     Age: 66 years     Sex: Female     Is Non-Hispanic African American: Yes     Diabetic: No     Tobacco smoker: No     Systolic Blood Pressure: 132 mmHg     Is BP treated: Yes     HDL Cholesterol: 67 mg/dL     Total Cholesterol: 239 mg/dL  Objective:  Physical Exam Constitutional:      Appearance: Normal appearance.  HENT:     Head: Normocephalic.     Nose: No congestion or rhinorrhea.  Cardiovascular:     Rate and Rhythm: Normal rate and regular rhythm.     Pulses: Normal pulses.  Heart sounds: Normal heart sounds.  Pulmonary:     Effort: Pulmonary effort is normal.     Breath sounds: Normal breath sounds.  Abdominal:     General: Bowel sounds are normal.  Musculoskeletal:        General: Normal range of motion.  Skin:    General: Skin is warm and dry.  Neurological:     General: No focal deficit present.     Mental Status: She is alert and oriented to person, place, and time. Mental status is at baseline.  Psychiatric:        Mood and Affect: Mood normal.         Assessment And Plan:  Routine medical exam  Primary hypertension Assessment & Plan: Low salt diet advised; take all meds as prescribed.  Orders: -     BMP8+eGFR -     CBC with Differential/Platelet  Vitamin D deficiency -     VITAMIN D 25 Hydroxy (Vit-D Deficiency, Fractures)  Immunization due -     Tdap vaccine greater than or equal to 7yo  IM  Anxiety Assessment & Plan: Continue hydroxyzine 25 mg as needed   Class 3 severe obesity due to excess calories with body mass index (BMI) of 45.0 to 49.9 in adult, unspecified whether serious comorbidity present [facility]) Assessment & Plan: She is encouraged to strive for BMI less than 30 to decrease cardiac risk. Advised to aim for at least 150 minutes of exercise per week. So far she has lost over 10 lbs on Wegovy. SQ injection.     Return in about 2 months (around 08/04/2023) for Weight Management, 2 weeks  nurse visit BP check.   Patient was given opportunity to ask questions. Patient verbalized understanding of the plan and was able to repeat key elements of the plan. All questions were answered to their satisfaction.    I, Ellender Hose, NP, have reviewed all documentation for this visit. The documentation on 06/08/2023 for the exam, diagnosis, procedures, and orders are all accurate and complete.   IF YOU HAVE BEEN REFERRED TO A SPECIALIST, IT MAY TAKE 1-2 WEEKS TO SCHEDULE/PROCESS THE REFERRAL. IF YOU HAVE NOT HEARD FROM US/SPECIALIST IN TWO WEEKS, PLEASE GIVE [place] A CALL AT [phone].

## 2023-06-05 LAB — BMP8+EGFR
BUN/Creatinine Ratio: 13 (ref 9–23)
BUN: 12 mg/dL (ref 6–24)
CO2: 23 mmol/L (ref 20–29)
Calcium: 9.6 mg/dL (ref 8.7–10.2)
Chloride: 101 mmol/L (ref 96–106)
Creatinine, Ser: 0.93 mg/dL (ref 0.57–1.00)
Glucose: 87 mg/dL (ref 70–99)
Potassium: 4.2 mmol/L (ref 3.5–5.2)
Sodium: 139 mmol/L (ref 134–144)
eGFR: 79 mL/min/1.73

## 2023-06-05 LAB — CBC WITH DIFFERENTIAL/PLATELET
Basophils Absolute: 0.1 x10E3/uL (ref 0.0–0.2)
Basos: 1 %
EOS (ABSOLUTE): 0.1 x10E3/uL (ref 0.0–0.4)
Eos: 2 %
Hematocrit: 41.5 % (ref 34.0–46.6)
Hemoglobin: 13.9 g/dL (ref 11.1–15.9)
Immature Grans (Abs): 0 x10E3/uL (ref 0.0–0.1)
Immature Granulocytes: 0 %
Lymphocytes Absolute: 2.7 x10E3/uL (ref 0.7–3.1)
Lymphs: 44 %
MCH: 30.4 pg (ref 26.6–33.0)
MCHC: 33.5 g/dL (ref 31.5–35.7)
MCV: 91 fL (ref 79–97)
Monocytes Absolute: 0.5 x10E3/uL (ref 0.1–0.9)
Monocytes: 8 %
Neutrophils Absolute: 2.8 x10E3/uL (ref 1.4–7.0)
Neutrophils: 45 %
Platelets: 333 x10E3/uL (ref 150–450)
RBC: 4.57 x10E6/uL (ref 3.77–5.28)
RDW: 12.4 % (ref 11.7–15.4)
WBC: 6.2 x10E3/uL (ref 3.4–10.8)

## 2023-06-05 LAB — VITAMIN D 25 HYDROXY (VIT D DEFICIENCY, FRACTURES): Vit D, 25-Hydroxy: 23 ng/mL — ABNORMAL LOW (ref 30.0–100.0)

## 2023-06-06 ENCOUNTER — Other Ambulatory Visit (HOSPITAL_COMMUNITY): Payer: Self-pay

## 2023-06-06 ENCOUNTER — Other Ambulatory Visit: Payer: Self-pay

## 2023-06-06 MED ORDER — WEGOVY 0.5 MG/0.5ML ~~LOC~~ SOAJ
0.5000 mg | SUBCUTANEOUS | 1 refills | Status: DC
Start: 2023-06-06 — End: 2023-08-13
  Filled 2023-06-06: qty 2, 28d supply, fill #0
  Filled 2023-07-15: qty 2, 28d supply, fill #1

## 2023-06-09 ENCOUNTER — Other Ambulatory Visit (HOSPITAL_COMMUNITY): Payer: Self-pay

## 2023-06-11 DIAGNOSIS — Z0000 Encounter for general adult medical examination without abnormal findings: Secondary | ICD-10-CM | POA: Insufficient documentation

## 2023-06-11 DIAGNOSIS — F419 Anxiety disorder, unspecified: Secondary | ICD-10-CM | POA: Insufficient documentation

## 2023-06-11 DIAGNOSIS — E559 Vitamin D deficiency, unspecified: Secondary | ICD-10-CM | POA: Insufficient documentation

## 2023-06-11 MED ORDER — VITAMIN D (ERGOCALCIFEROL) 1.25 MG (50000 UNIT) PO CAPS: 50000.0000 [IU] | ORAL_CAPSULE | ORAL | 2 refills | Status: AC

## 2023-06-11 NOTE — Assessment & Plan Note (Signed)
She is encouraged to strive for BMI less than 30 to decrease cardiac risk. Advised to aim for at least 150 minutes of exercise per week. So far she has lost over 10 lbs on Wegovy. SQ injection.

## 2023-06-11 NOTE — Assessment & Plan Note (Signed)
Continue hydroxyzine 25 mg as needed

## 2023-06-11 NOTE — Assessment & Plan Note (Signed)
Low salt diet advised; take all meds as prescribed.

## 2023-06-11 NOTE — Progress Notes (Signed)
All labs are normal except for Vit D which is low. Once weekly Vitamin D supplement sent to the pharmacy

## 2023-06-15 DIAGNOSIS — Z419 Encounter for procedure for purposes other than remedying health state, unspecified: Secondary | ICD-10-CM | POA: Diagnosis not present

## 2023-06-18 ENCOUNTER — Encounter: Payer: Medicaid Other | Admitting: Family Medicine

## 2023-06-20 ENCOUNTER — Ambulatory Visit: Payer: Medicaid Other

## 2023-06-20 VITALS — BP 122/80 | HR 101 | Temp 98.1°F | Ht 63.0 in | Wt 257.0 lb

## 2023-06-20 DIAGNOSIS — I10 Essential (primary) hypertension: Secondary | ICD-10-CM

## 2023-06-20 NOTE — Progress Notes (Signed)
Patient presents today for a BP check, patient currently taking losartan-hydrochlorothiazide 50-12.5mg  AM. BP Readings from Last 3 Encounters:  06/20/23 120/80  06/04/23 (!) 132/90  03/12/23 124/86   Per Provider-  Better- check at next visit.

## 2023-07-11 ENCOUNTER — Other Ambulatory Visit: Payer: Self-pay | Admitting: Family Medicine

## 2023-07-11 DIAGNOSIS — I10 Essential (primary) hypertension: Secondary | ICD-10-CM

## 2023-07-16 DIAGNOSIS — Z419 Encounter for procedure for purposes other than remedying health state, unspecified: Secondary | ICD-10-CM | POA: Diagnosis not present

## 2023-08-05 ENCOUNTER — Other Ambulatory Visit: Payer: Self-pay | Admitting: Family Medicine

## 2023-08-06 ENCOUNTER — Ambulatory Visit: Payer: Medicaid Other | Admitting: Family Medicine

## 2023-08-07 ENCOUNTER — Other Ambulatory Visit (HOSPITAL_COMMUNITY): Payer: Self-pay

## 2023-08-07 ENCOUNTER — Encounter (HOSPITAL_COMMUNITY): Payer: Self-pay

## 2023-08-13 ENCOUNTER — Other Ambulatory Visit (HOSPITAL_COMMUNITY): Payer: Self-pay

## 2023-08-13 ENCOUNTER — Ambulatory Visit (INDEPENDENT_AMBULATORY_CARE_PROVIDER_SITE_OTHER): Payer: Medicaid Other | Admitting: Family Medicine

## 2023-08-13 ENCOUNTER — Encounter: Payer: Self-pay | Admitting: Family Medicine

## 2023-08-13 VITALS — BP 120/82 | HR 99 | Temp 99.3°F | Ht 63.0 in | Wt 248.0 lb

## 2023-08-13 DIAGNOSIS — E66813 Obesity, class 3: Secondary | ICD-10-CM | POA: Diagnosis not present

## 2023-08-13 DIAGNOSIS — K5909 Other constipation: Secondary | ICD-10-CM | POA: Diagnosis not present

## 2023-08-13 DIAGNOSIS — F4010 Social phobia, unspecified: Secondary | ICD-10-CM

## 2023-08-13 DIAGNOSIS — Z6841 Body Mass Index (BMI) 40.0 and over, adult: Secondary | ICD-10-CM | POA: Diagnosis not present

## 2023-08-13 DIAGNOSIS — K219 Gastro-esophageal reflux disease without esophagitis: Secondary | ICD-10-CM | POA: Diagnosis not present

## 2023-08-13 MED ORDER — PANTOPRAZOLE SODIUM 40 MG PO TBEC
40.0000 mg | DELAYED_RELEASE_TABLET | Freq: Every day | ORAL | 5 refills | Status: DC
  Filled 2023-08-13: qty 30, 30d supply, fill #0
  Filled 2023-09-09: qty 30, 30d supply, fill #1

## 2023-08-13 MED ORDER — SERTRALINE HCL 50 MG PO TABS
50.0000 mg | ORAL_TABLET | Freq: Every day | ORAL | 2 refills | Status: DC
  Filled 2023-08-13: qty 30, 30d supply, fill #0
  Filled 2023-09-09: qty 30, 30d supply, fill #1
  Filled 2023-10-15: qty 30, 30d supply, fill #2

## 2023-08-13 MED ORDER — POLYETHYLENE GLYCOL 3350 17 GM/SCOOP PO POWD
1.0000 | Freq: Once | ORAL | 0 refills | Status: AC
  Filled 2023-08-13: qty 238, 1d supply, fill #0

## 2023-08-13 MED ORDER — WEGOVY 1 MG/0.5ML ~~LOC~~ SOAJ
1.0000 mg | SUBCUTANEOUS | 1 refills | Status: DC
  Filled 2023-08-13: qty 2, 28d supply, fill #0
  Filled 2023-09-09: qty 2, 28d supply, fill #1

## 2023-08-13 NOTE — Patient Instructions (Signed)

## 2023-08-13 NOTE — Progress Notes (Signed)
I,Jameka J Llittleton, CMA,acting as a Neurosurgeon for [employer], NP.,have documented all relevant documentation on the behalf of Ellender Hose, NP,as directed by  Ellender Hose, NP while in the presence of Ellender Hose, NP.  Subjective:  Patient ID: Terry Osborn , female    DOB: 1980-08-11 , 43 y.o.   MRN: [id number]  Chief Complaint  Patient presents with   Weight Check    HPI  Patient is a 42 year old female who presents today for a weight management. Patient has been  on Wegovy 0.5 mg Q weekly for the past 2 months, Patient reports compliance with her wegovy and reports that she is tolerating the medication very well except that she has noticed a little bit  of constipation and acid reflux. Overall, patient states that she is pleased with the medication and she has lost a little over 20 lbs since she started the medication.     Past Medical History:  Diagnosis Date   Anxiety    Asthma    Hypertension      Family History  Problem Relation Age of Onset   Diabetes Mother    Hypertension Mother      Current Outpatient Medications:    hydrOXYzine (VISTARIL) 25 MG capsule, TAKE 1 CAPSULE (25 MG TOTAL) BY MOUTH DAILY AS NEEDED FOR ANXIETY., Disp: 90 capsule, Rfl: 1   losartan-hydrochlorothiazide (HYZAAR) 50-12.5 MG tablet, TAKE 1 TABLET BY MOUTH EVERY DAY, Disp: 90 tablet, Rfl: 1   pantoprazole (PROTONIX) 40 MG tablet, Take 1 tablet (40 mg total) by mouth daily., Disp: 30 tablet, Rfl: 5   Semaglutide-Weight Management (WEGOVY) 1 MG/0.5ML SOAJ, Inject 1 mg into the skin every 7 (seven) days., Disp: 2 mL, Rfl: 1   sertraline (ZOLOFT) 50 MG tablet, Take 1 tablet (50 mg total) by mouth daily., Disp: 30 tablet, Rfl: 2   Vitamin D, Ergocalciferol, (DRISDOL) 1.25 MG (50000 UNIT) CAPS capsule, Take 1 capsule (50,000 Units total) by mouth every 7 (seven) days., Disp: 5 capsule, Rfl: 2   No Known Allergies   Review of Systems  Constitutional: Negative.   HENT: Negative.    Respiratory:  Negative.    Cardiovascular:  Negative for chest pain, palpitations and leg swelling.  Gastrointestinal: Negative.   Genitourinary: Negative.   Musculoskeletal: Negative.   Skin: Negative.   Neurological: Negative.      Today's Vitals   08/13/23 1405  BP: 120/82  Pulse: 99  Temp: 99.3 F (37.4 C)  Weight: 248 lb (112.5 kg)  Height: 5\' 3"  (1.6 m)  PainSc: 0-No pain   Body mass index is 43.93 kg/m.  Wt Readings from Last 3 Encounters:  08/13/23 248 lb (112.5 kg)  06/20/23 257 lb (116.6 kg)  06/04/23 257 lb (116.6 kg)    The 10-year ASCVD risk score (Arnett DK, et al., 2019) is: 0.9%   Values used to calculate the score:     Age: 21 years     Sex: Female     Is Non-Hispanic African American: Yes     Diabetic: No     Tobacco smoker: No     Systolic Blood Pressure: 120 mmHg     Is BP treated: Yes     HDL Cholesterol: 67 mg/dL     Total Cholesterol: 239 mg/dL  Objective:  Physical Exam HENT:     Head: Normocephalic.  Cardiovascular:     Rate and Rhythm: Regular rhythm.  Pulmonary:     Effort: Pulmonary effort is normal.  Breath sounds: Normal breath sounds.  Abdominal:     General: Bowel sounds are normal.  Skin:    General: Skin is warm and dry.  Neurological:     General: No focal deficit present.     Mental Status: She is alert and oriented to person, place, and time.  Psychiatric:        Mood and Affect: Mood normal.         Assessment And Plan:  Class 3 severe obesity with body mass index (BMI) of 40.0 to 44.9 in adult, unspecified obesity type, unspecified whether serious comorbidity present [facility]) Assessment & Plan: Has been exercising with diet for the past 6 months, with no success. Will continue but also continue Wegovy once weekly injection.   Orders: -     Wegovy; Inject 1 mg into the skin every 7 (seven) days.  Dispense: 2 mL; Refill: 1  Other constipation Assessment & Plan: Eat fiber plus drink 6-8 glasses of water daily to minimize  constipation.  Orders: -     Polyethylene Glycol 3350; Take as directed  Dispense: 238 g; Refill: 0  Gastroesophageal reflux disease without esophagitis Assessment & Plan: Avoid spicy foods and take pantoprazole once daily.  Orders: -     Pantoprazole Sodium; Take 1 tablet (40 mg total) by mouth daily.  Dispense: 30 tablet; Refill: 5  Social anxiety disorder Assessment & Plan: Start zoloft 50 mg every day .  Orders: -     Sertraline HCl; Take 1 tablet (50 mg total) by mouth daily.  Dispense: 30 tablet; Refill: 2    Return for 2 month weight check.  Patient was given opportunity to ask questions. Patient verbalized understanding of the plan and was able to repeat key elements of the plan. All questions were answered to their satisfaction.    I, Ellender Hose, NP, have reviewed all documentation for this visit. The documentation on 08/18/2023  for the exam, diagnosis, procedures, and orders are all accurate and complete.   IF YOU HAVE BEEN REFERRED TO A SPECIALIST, IT MAY TAKE 1-2 WEEKS TO SCHEDULE/PROCESS THE REFERRAL. IF YOU HAVE NOT HEARD FROM US/SPECIALIST IN TWO WEEKS, PLEASE GIVE [place] A CALL AT [phone].

## 2023-08-15 ENCOUNTER — Other Ambulatory Visit (HOSPITAL_COMMUNITY): Payer: Self-pay

## 2023-08-16 DIAGNOSIS — Z419 Encounter for procedure for purposes other than remedying health state, unspecified: Secondary | ICD-10-CM | POA: Diagnosis not present

## 2023-08-18 DIAGNOSIS — F4010 Social phobia, unspecified: Secondary | ICD-10-CM | POA: Insufficient documentation

## 2023-08-18 NOTE — Assessment & Plan Note (Signed)
Avoid spicy foods and take pantoprazole once daily.

## 2023-08-18 NOTE — Assessment & Plan Note (Signed)
Start zoloft 50 mg every day .

## 2023-08-18 NOTE — Assessment & Plan Note (Signed)
Eat fiber plus drink 6-8 glasses of water daily to minimize constipation.

## 2023-08-18 NOTE — Assessment & Plan Note (Signed)
Has been exercising with diet for the past 6 months, with no success. Will continue but also continue Wegovy once weekly injection.

## 2023-09-13 DIAGNOSIS — Z419 Encounter for procedure for purposes other than remedying health state, unspecified: Secondary | ICD-10-CM | POA: Diagnosis not present

## 2023-09-16 ENCOUNTER — Other Ambulatory Visit (HOSPITAL_COMMUNITY): Payer: Self-pay

## 2023-10-10 ENCOUNTER — Ambulatory Visit: Payer: Medicaid Other | Admitting: Family Medicine

## 2023-10-15 ENCOUNTER — Ambulatory Visit (INDEPENDENT_AMBULATORY_CARE_PROVIDER_SITE_OTHER): Admitting: Family Medicine

## 2023-10-15 ENCOUNTER — Other Ambulatory Visit (HOSPITAL_COMMUNITY): Payer: Self-pay

## 2023-10-15 ENCOUNTER — Other Ambulatory Visit: Payer: Self-pay | Admitting: Family Medicine

## 2023-10-15 VITALS — BP 120/76 | HR 98 | Temp 98.3°F | Ht 63.0 in | Wt 247.0 lb

## 2023-10-15 DIAGNOSIS — E785 Hyperlipidemia, unspecified: Secondary | ICD-10-CM

## 2023-10-15 DIAGNOSIS — F4010 Social phobia, unspecified: Secondary | ICD-10-CM | POA: Diagnosis not present

## 2023-10-15 DIAGNOSIS — R7309 Other abnormal glucose: Secondary | ICD-10-CM

## 2023-10-15 DIAGNOSIS — K5909 Other constipation: Secondary | ICD-10-CM

## 2023-10-15 DIAGNOSIS — E66813 Obesity, class 3: Secondary | ICD-10-CM

## 2023-10-15 DIAGNOSIS — Z6841 Body Mass Index (BMI) 40.0 and over, adult: Secondary | ICD-10-CM

## 2023-10-15 DIAGNOSIS — E559 Vitamin D deficiency, unspecified: Secondary | ICD-10-CM | POA: Diagnosis not present

## 2023-10-15 MED ORDER — PEG 3350 17 GM/SCOOP PO POWD: 1.0000 | Freq: Every day | ORAL | 0 refills | Status: DC

## 2023-10-15 MED ORDER — PEG 3350 17 GM/SCOOP PO POWD: 17.0000 g | Freq: Every day | ORAL | 0 refills | Status: DC

## 2023-10-15 MED ORDER — WEGOVY 1.7 MG/0.75ML ~~LOC~~ SOAJ
1.7000 mg | SUBCUTANEOUS | 1 refills | Status: DC
  Filled 2023-10-15 – 2023-10-16 (×4): qty 3, 28d supply, fill #0
  Filled 2023-11-09: qty 3, 28d supply, fill #1

## 2023-10-15 NOTE — Assessment & Plan Note (Addendum)
 Wt Readings from Last 3 Encounters:  10/15/23 247 lb (112 kg)  08/13/23 248 lb (112.5 kg)  06/20/23 257 lb (116.6 kg)   Has been exercising with diet for the past 6 months, with no success. Will continue but also continue Wegovy once weekly injection, dose increased to 1.7 mg SQ weekly.

## 2023-10-15 NOTE — Patient Instructions (Signed)

## 2023-10-15 NOTE — Assessment & Plan Note (Signed)
 Will restart zoloft 50 mg every day because she admits that she stops after 3 days

## 2023-10-15 NOTE — Assessment & Plan Note (Signed)
 Lab Results  Component Value Date   CHOL 239 (H) 03/12/2023   CHOL 216 (H) 03/14/2022   CHOL 220 (H) 11/08/2020   Lab Results  Component Value Date   HDL 67 03/12/2023   HDL 73 03/14/2022   HDL 76 11/08/2020   Lab Results  Component Value Date   LDLCALC 155 (H) 03/12/2023   LDLCALC 121 (H) 03/14/2022   LDLCALC 118 (H) 11/08/2020   Lab Results  Component Value Date   TRIG 95 03/12/2023   TRIG 116 03/14/2022   TRIG 138 11/08/2020   Lab Results  Component Value Date   CHOLHDL 3.6 03/12/2023   CHOLHDL 3.0 03/14/2022   CHOLHDL 2.9 11/08/2020   No results found for: "LDLDIRECT"   Recheck labs today

## 2023-10-15 NOTE — Progress Notes (Signed)
 I,Jameka J Llittleton, CMA,acting as a Neurosurgeon for [employer], NP.,have documented all relevant documentation on the behalf of Ellender Hose, NP,as directed by  Ellender Hose, NP while in the presence of Ellender Hose, NP.  Subjective:  Patient ID: Terry Osborn , female    DOB: 1980/12/04 , 43 y.o.   MRN: [id number]  Chief Complaint  Patient presents with   Weight Check    HPI  Patient is a 43 year old female who presents today for weight management. She is on Wegovy 1 mg injection SQ every 7 days. Patient states that her last dose was last Wednesday , she has been doing well with no side effects. She reports that she stopped taking Zoloft 50 mg every day that she was given for social anxiety, that is why her HR was elevated initially when she came into the office. Will monitor and start her on a betablocker if need be. Will increase dose of Wegovy today, patient lost 1 pound since last visit.     Past Medical History:  Diagnosis Date   Anxiety    Asthma    Hypertension      Family History  Problem Relation Age of Onset   Diabetes Mother    Hypertension Mother      Current Outpatient Medications:    hydrOXYzine (VISTARIL) 25 MG capsule, TAKE 1 CAPSULE (25 MG TOTAL) BY MOUTH DAILY AS NEEDED FOR ANXIETY., Disp: 90 capsule, Rfl: 1   losartan-hydrochlorothiazide (HYZAAR) 50-12.5 MG tablet, TAKE 1 TABLET BY MOUTH EVERY DAY, Disp: 90 tablet, Rfl: 1   Semaglutide-Weight Management (WEGOVY) 1.7 MG/0.75ML SOAJ, Inject 1.7 mg into the skin every 7 (seven) days., Disp: 3 mL, Rfl: 1   Vitamin D, Ergocalciferol, (DRISDOL) 1.25 MG (50000 UNIT) CAPS capsule, Take 1 capsule (50,000 Units total) by mouth every 7 (seven) days., Disp: 5 capsule, Rfl: 2   pantoprazole (PROTONIX) 40 MG tablet, Take 1 tablet (40 mg total) by mouth daily. (Patient not taking: Reported on 10/15/2023), Disp: 30 tablet, Rfl: 5   sertraline (ZOLOFT) 50 MG tablet, Take 1 tablet (50 mg total) by mouth daily. (Patient not taking:  Reported on 10/15/2023), Disp: 30 tablet, Rfl: 2   No Known Allergies   Review of Systems  Constitutional: Negative.   HENT: Negative.    Respiratory: Negative.    Cardiovascular: Negative.   Gastrointestinal: Negative.   Genitourinary: Negative.   Musculoskeletal: Negative.   Psychiatric/Behavioral:  The patient is nervous/anxious.      Today's Vitals   10/15/23 1129 10/15/23 1153  BP: (!) 122/90 120/76  Pulse: (!) 108 98  Temp: 98.3 F (36.8 C)   TempSrc: Oral   Weight: 247 lb (112 kg)   Height: 5\' 3"  (1.6 m)   PainSc: 0-No pain    Body mass index is 43.75 kg/m.  Wt Readings from Last 3 Encounters:  10/15/23 247 lb (112 kg)  08/13/23 248 lb (112.5 kg)  06/20/23 257 lb (116.6 kg)    The 10-year ASCVD risk score (Arnett DK, et al., 2019) is: 1%   Values used to calculate the score:     Age: 14 years     Sex: Female     Is Non-Hispanic African American: Yes     Diabetic: No     Tobacco smoker: No     Systolic Blood Pressure: 120 mmHg     Is BP treated: Yes     HDL Cholesterol: 61 mg/dL     Total Cholesterol: 214 mg/dL  Objective:  Physical Exam HENT:     Head: Normocephalic.  Cardiovascular:     Rate and Rhythm: Normal rate and regular rhythm.  Pulmonary:     Effort: Pulmonary effort is normal.     Breath sounds: Normal breath sounds.  Neurological:     Mental Status: She is alert.         Assessment And Plan:  Class 3 severe obesity with body mass index (BMI) of 40.0 to 44.9 in adult, unspecified obesity type, unspecified whether serious comorbidity present [facility]) Assessment & Plan: Wt Readings from Last 3 Encounters:  10/15/23 247 lb (112 kg)  08/13/23 248 lb (112.5 kg)  06/20/23 257 lb (116.6 kg)   Has been exercising with diet for the past 6 months, with no success. Will continue but also continue Wegovy once weekly injection, dose increased to 1.7 mg SQ weekly.  Orders: -     Wegovy; Inject 1.7 mg into the skin every 7 (seven) days.  Dispense: 3  mL; Refill: 1  Other constipation -     PEG 3350; Take 238 g by mouth daily for 1 day.  Dispense: 238 g; Refill: 0  Social anxiety disorder Assessment & Plan: Will restart zoloft 50 mg every day because she admits that she stops after 3 days  Orders: -     BMP8+eGFR -     CBC with Differential/Platelet  Vitamin D deficiency Assessment & Plan: vitamin d levels checked.   Hyperlipidemia LDL goal <100 Assessment & Plan: Lab Results  Component Value Date   CHOL 239 (H) 03/12/2023   CHOL 216 (H) 03/14/2022   CHOL 220 (H) 11/08/2020   Lab Results  Component Value Date   HDL 67 03/12/2023   HDL 73 03/14/2022   HDL 76 11/08/2020   Lab Results  Component Value Date   LDLCALC 155 (H) 03/12/2023   LDLCALC 121 (H) 03/14/2022   LDLCALC 118 (H) 11/08/2020   Lab Results  Component Value Date   TRIG 95 03/12/2023   TRIG 116 03/14/2022   TRIG 138 11/08/2020   Lab Results  Component Value Date   CHOLHDL 3.6 03/12/2023   CHOLHDL 3.0 03/14/2022   CHOLHDL 2.9 11/08/2020   No results found for: "LDLDIRECT"   Recheck labs today  Orders: -     Lipid panel  Abnormal glucose Assessment & Plan: Lab Results  Component Value Date   HGBA1C 5.4 10/15/2023     Orders: -     Hemoglobin A1c    Return for 2 month weight check.  Patient was given opportunity to ask questions. Patient verbalized understanding of the plan and was able to repeat key elements of the plan. All questions were answered to their satisfaction.   I, Ellender Hose, NP, have reviewed all documentation for this visit. The documentation on 10/20/2023 for the exam, diagnosis, procedures, and orders are all accurate and complete.   IF YOU HAVE BEEN REFERRED TO A SPECIALIST, IT MAY TAKE 1-2 WEEKS TO SCHEDULE/PROCESS THE REFERRAL. IF YOU HAVE NOT HEARD FROM US/SPECIALIST IN TWO WEEKS, PLEASE GIVE [place] A CALL AT [phone].

## 2023-10-16 ENCOUNTER — Other Ambulatory Visit: Payer: Self-pay

## 2023-10-16 ENCOUNTER — Other Ambulatory Visit (HOSPITAL_COMMUNITY): Payer: Self-pay

## 2023-10-16 LAB — CBC WITH DIFFERENTIAL/PLATELET
Basophils Absolute: 0.1 10*3/uL (ref 0.0–0.2)
Basos: 1 %
EOS (ABSOLUTE): 0.2 10*3/uL (ref 0.0–0.4)
Eos: 3 %
Hematocrit: 42.7 % (ref 34.0–46.6)
Hemoglobin: 14.2 g/dL (ref 11.1–15.9)
Immature Grans (Abs): 0 10*3/uL (ref 0.0–0.1)
Immature Granulocytes: 0 %
Lymphocytes Absolute: 2.6 10*3/uL (ref 0.7–3.1)
Lymphs: 38 %
MCH: 30.2 pg (ref 26.6–33.0)
MCHC: 33.3 g/dL (ref 31.5–35.7)
MCV: 91 fL (ref 79–97)
Monocytes Absolute: 0.5 10*3/uL (ref 0.1–0.9)
Monocytes: 7 %
Neutrophils Absolute: 3.4 10*3/uL (ref 1.4–7.0)
Neutrophils: 51 %
Platelets: 447 10*3/uL (ref 150–450)
RBC: 4.7 x10E6/uL (ref 3.77–5.28)
RDW: 13.1 % (ref 11.7–15.4)
WBC: 6.8 10*3/uL (ref 3.4–10.8)

## 2023-10-16 LAB — BMP8+EGFR
BUN/Creatinine Ratio: 8 — ABNORMAL LOW (ref 9–23)
BUN: 7 mg/dL (ref 6–24)
CO2: 25 mmol/L (ref 20–29)
Calcium: 9.8 mg/dL (ref 8.7–10.2)
Chloride: 98 mmol/L (ref 96–106)
Creatinine, Ser: 0.83 mg/dL (ref 0.57–1.00)
Glucose: 81 mg/dL (ref 70–99)
Potassium: 4.1 mmol/L (ref 3.5–5.2)
Sodium: 141 mmol/L (ref 134–144)
eGFR: 91 mL/min/{1.73_m2} (ref >59)

## 2023-10-16 LAB — LIPID PANEL
Chol/HDL Ratio: 3.5 ratio (ref 0.0–4.4)
Cholesterol, Total: 214 mg/dL — ABNORMAL HIGH (ref 100–199)
HDL: 61 mg/dL (ref >39)
LDL Chol Calc (NIH): 131 mg/dL — ABNORMAL HIGH (ref 0–99)
Triglycerides: 122 mg/dL (ref 0–149)
VLDL Cholesterol Cal: 22 mg/dL (ref 5–40)

## 2023-10-16 LAB — HEMOGLOBIN A1C
Est. average glucose Bld gHb Est-mCnc: 108 mg/dL
Hgb A1c MFr Bld: 5.4 % (ref 4.8–5.6)

## 2023-10-20 DIAGNOSIS — R7309 Other abnormal glucose: Secondary | ICD-10-CM | POA: Insufficient documentation

## 2023-10-20 NOTE — Assessment & Plan Note (Signed)
 vitamin d levels checked.

## 2023-10-20 NOTE — Assessment & Plan Note (Signed)
 Lab Results  Component Value Date   HGBA1C 5.4 10/15/2023

## 2023-10-24 MED ORDER — PEG 3350 17 GM/SCOOP PO POWD: 1.0000 | Freq: Every day | ORAL | 0 refills | Status: AC

## 2023-10-25 DIAGNOSIS — Z419 Encounter for procedure for purposes other than remedying health state, unspecified: Secondary | ICD-10-CM | POA: Diagnosis not present

## 2023-11-20 ENCOUNTER — Encounter: Payer: Self-pay | Admitting: Family Medicine

## 2023-11-20 NOTE — Progress Notes (Signed)
 Your cholesterol is still elevated. No fat diet and exercise is advised.

## 2023-11-24 DIAGNOSIS — Z419 Encounter for procedure for purposes other than remedying health state, unspecified: Secondary | ICD-10-CM | POA: Diagnosis not present

## 2023-12-17 ENCOUNTER — Ambulatory Visit: Admitting: Family Medicine

## 2023-12-17 ENCOUNTER — Other Ambulatory Visit (HOSPITAL_COMMUNITY): Payer: Self-pay

## 2023-12-17 ENCOUNTER — Encounter: Payer: Self-pay | Admitting: Family Medicine

## 2023-12-17 VITALS — BP 120/76 | HR 76 | Temp 98.1°F | Ht 63.0 in | Wt 239.0 lb

## 2023-12-17 DIAGNOSIS — E66813 Obesity, class 3: Secondary | ICD-10-CM

## 2023-12-17 DIAGNOSIS — Z6841 Body Mass Index (BMI) 40.0 and over, adult: Secondary | ICD-10-CM

## 2023-12-17 DIAGNOSIS — E669 Obesity, unspecified: Secondary | ICD-10-CM | POA: Insufficient documentation

## 2023-12-17 DIAGNOSIS — Z2821 Immunization not carried out because of patient refusal: Secondary | ICD-10-CM | POA: Diagnosis not present

## 2023-12-17 MED ORDER — WEGOVY 2.4 MG/0.75ML ~~LOC~~ SOAJ
2.4000 mg | SUBCUTANEOUS | 1 refills | Status: AC
  Filled 2023-12-17: qty 3, 28d supply, fill #0

## 2023-12-17 NOTE — Progress Notes (Signed)
 I,Jameka J Llittleton, CMA,acting as a Neurosurgeon for [employer], NP.,have documented all relevant documentation on the behalf of Melodie Spry, NP,as directed by  Melodie Spry, NP while in the presence of Melodie Spry, NP.  Subjective:  Patient ID: Terry Osborn , female    DOB: 24-Feb-1981 , 43 y.o.   MRN: [id number]  Chief Complaint  Patient presents with   Weight Check    HPI  Patient is a 43 year old female presents today for a weight check. She has lost over 30 lbs so far on Wegovy . No complains.     Past Medical History:  Diagnosis Date   Anxiety    Asthma    Hypertension      Family History  Problem Relation Age of Onset   Diabetes Mother    Hypertension Mother      Current Outpatient Medications:    hydrOXYzine  (VISTARIL ) 25 MG capsule, TAKE 1 CAPSULE (25 MG TOTAL) BY MOUTH DAILY AS NEEDED FOR ANXIETY., Disp: 90 capsule, Rfl: 1   losartan -hydrochlorothiazide (HYZAAR) 50-12.5 MG tablet, TAKE 1 TABLET BY MOUTH EVERY DAY, Disp: 90 tablet, Rfl: 1   Semaglutide -Weight Management (WEGOVY ) 2.4 MG/0.75ML SOAJ, Inject 2.4 mg into the skin every 7 (seven) days., Disp: 3 mL, Rfl: 1   Vitamin D , Ergocalciferol , (DRISDOL ) 1.25 MG (50000 UNIT) CAPS capsule, Take 1 capsule (50,000 Units total) by mouth every 7 (seven) days. (Patient not taking: Reported on 12/17/2023), Disp: 5 capsule, Rfl: 2   No Known Allergies   Review of Systems  Constitutional: Negative.   HENT: Negative.    Eyes: Negative.   Respiratory: Negative.    Cardiovascular: Negative.   Neurological: Negative.   Hematological: Negative.   Psychiatric/Behavioral: Negative.       Today's Vitals   12/17/23 1150 12/17/23 1232  BP: 120/76   Pulse: (!) 107 76  Temp: 98.1 F (36.7 C)   TempSrc: Oral   Weight: 239 lb (108.4 kg)   Height: 5' 3 (1.6 m)   PainSc: 0-No pain    Body mass index is 42.34 kg/m.  Wt Readings from Last 3 Encounters:  12/17/23 239 lb (108.4 kg)  10/15/23 247 lb (112 kg)  08/13/23 248 lb  (112.5 kg)    The 10-year ASCVD risk score (Arnett DK, et al., 2019) is: 1%   Values used to calculate the score:     Age: 37 years     Sex: Female     Is Non-Hispanic African American: Yes     Diabetic: No     Tobacco smoker: No     Systolic Blood Pressure: 120 mmHg     Is BP treated: Yes     HDL Cholesterol: 61 mg/dL     Total Cholesterol: 214 mg/dL  Objective:  Physical Exam HENT:     Head: Normocephalic.  Cardiovascular:     Rate and Rhythm: Normal rate and regular rhythm.  Pulmonary:     Effort: Pulmonary effort is normal.     Breath sounds: Normal breath sounds.  Musculoskeletal:        General: Normal range of motion.  Skin:    General: Skin is warm and dry.  Neurological:     General: No focal deficit present.     Mental Status: She is alert and oriented to person, place, and time.        Assessment And Plan:  Class 3 severe obesity with body mass index (BMI) of 40.0 to 44.9 in adult, unspecified obesity type,  unspecified whether serious comorbidity present -     Wegovy ; Inject 2.4 mg into the skin every 7 (seven) days.  Dispense: 3 mL; Refill: 1  23-polyvalent pneumococcal polysaccharide vaccine declined    Return for 2 month weight check.  Patient was given opportunity to ask questions. Patient verbalized understanding of the plan and was able to repeat key elements of the plan. All questions were answered to their satisfaction.    I, Melodie Spry, NP, have reviewed all documentation for this visit. The documentation on 12/17/23 for the exam, diagnosis, procedures, and orders are all accurate and complete.   IF YOU HAVE BEEN REFERRED TO A SPECIALIST, IT MAY TAKE 1-2 WEEKS TO SCHEDULE/PROCESS THE REFERRAL. IF YOU HAVE NOT HEARD FROM US /SPECIALIST IN TWO WEEKS, PLEASE GIVE US  A CALL AT [phone].

## 2023-12-17 NOTE — Patient Instructions (Signed)

## 2023-12-22 ENCOUNTER — Inpatient Hospital Stay (HOSPITAL_COMMUNITY)
Admission: EM | Admit: 2023-12-22 | Discharge: 2024-01-13 | DRG: 604 | Disposition: E | Attending: General Surgery | Admitting: General Surgery

## 2023-12-22 ENCOUNTER — Encounter (HOSPITAL_COMMUNITY): Payer: Self-pay

## 2023-12-22 ENCOUNTER — Emergency Department (HOSPITAL_COMMUNITY)

## 2023-12-22 ENCOUNTER — Other Ambulatory Visit: Payer: Self-pay

## 2023-12-22 ENCOUNTER — Emergency Department (HOSPITAL_COMMUNITY): Admitting: Certified Registered Nurse Anesthetist

## 2023-12-22 ENCOUNTER — Encounter (HOSPITAL_COMMUNITY): Admission: EM | Disposition: E | Payer: Self-pay | Source: Home / Self Care | Attending: General Surgery

## 2023-12-22 DIAGNOSIS — J9601 Acute respiratory failure with hypoxia: Secondary | ICD-10-CM | POA: Diagnosis not present

## 2023-12-22 DIAGNOSIS — R578 Other shock: Secondary | ICD-10-CM | POA: Diagnosis present

## 2023-12-22 DIAGNOSIS — J9600 Acute respiratory failure, unspecified whether with hypoxia or hypercapnia: Secondary | ICD-10-CM | POA: Diagnosis present

## 2023-12-22 DIAGNOSIS — R231 Pallor: Secondary | ICD-10-CM | POA: Diagnosis not present

## 2023-12-22 DIAGNOSIS — I469 Cardiac arrest, cause unspecified: Secondary | ICD-10-CM | POA: Diagnosis present

## 2023-12-22 DIAGNOSIS — Z538 Procedure and treatment not carried out for other reasons: Secondary | ICD-10-CM | POA: Diagnosis not present

## 2023-12-22 DIAGNOSIS — R58 Hemorrhage, not elsewhere classified: Secondary | ICD-10-CM | POA: Diagnosis not present

## 2023-12-22 DIAGNOSIS — T794XXA Traumatic shock, initial encounter: Secondary | ICD-10-CM | POA: Diagnosis present

## 2023-12-22 DIAGNOSIS — W3400XA Accidental discharge from unspecified firearms or gun, initial encounter: Principal | ICD-10-CM

## 2023-12-22 DIAGNOSIS — F419 Anxiety disorder, unspecified: Secondary | ICD-10-CM | POA: Diagnosis present

## 2023-12-22 DIAGNOSIS — R939 Diagnostic imaging inconclusive due to excess body fat of patient: Secondary | ICD-10-CM | POA: Diagnosis not present

## 2023-12-22 DIAGNOSIS — S31643A Puncture wound with foreign body of abdominal wall, right lower quadrant with penetration into peritoneal cavity, initial encounter: Secondary | ICD-10-CM | POA: Diagnosis not present

## 2023-12-22 DIAGNOSIS — S31139A Puncture wound of abdominal wall without foreign body, unspecified quadrant without penetration into peritoneal cavity, initial encounter: Secondary | ICD-10-CM | POA: Diagnosis present

## 2023-12-22 DIAGNOSIS — I10 Essential (primary) hypertension: Secondary | ICD-10-CM | POA: Diagnosis present

## 2023-12-22 DIAGNOSIS — Z9889 Other specified postprocedural states: Secondary | ICD-10-CM

## 2023-12-22 DIAGNOSIS — R0989 Other specified symptoms and signs involving the circulatory and respiratory systems: Secondary | ICD-10-CM | POA: Diagnosis not present

## 2023-12-22 DIAGNOSIS — S299XXA Unspecified injury of thorax, initial encounter: Secondary | ICD-10-CM | POA: Diagnosis not present

## 2023-12-22 DIAGNOSIS — S3993XA Unspecified injury of pelvis, initial encounter: Secondary | ICD-10-CM | POA: Diagnosis not present

## 2023-12-22 DIAGNOSIS — I959 Hypotension, unspecified: Secondary | ICD-10-CM | POA: Diagnosis not present

## 2023-12-22 DIAGNOSIS — R001 Bradycardia, unspecified: Secondary | ICD-10-CM | POA: Diagnosis present

## 2023-12-22 DIAGNOSIS — R000 Tachycardia, unspecified: Secondary | ICD-10-CM | POA: Diagnosis not present

## 2023-12-22 HISTORY — DX: Anxiety disorder, unspecified: F41.9

## 2023-12-22 HISTORY — DX: Essential (primary) hypertension: I10

## 2023-12-22 LAB — I-STAT CHEM 8, ED
BUN: 10 mg/dL (ref 6–20)
Calcium, Ion: 0.68 mmol/L — CL (ref 1.15–1.40)
Chloride: 101 mmol/L (ref 98–111)
Creatinine, Ser: 1.2 mg/dL — ABNORMAL HIGH (ref 0.44–1.00)
Glucose, Bld: 311 mg/dL — ABNORMAL HIGH (ref 70–99)
HCT: 31 % — ABNORMAL LOW (ref 36.0–46.0)
Hemoglobin: 10.5 g/dL — ABNORMAL LOW (ref 12.0–15.0)
Potassium: 3.2 mmol/L — ABNORMAL LOW (ref 3.5–5.1)
Sodium: 140 mmol/L (ref 135–145)
TCO2: 15 mmol/L — ABNORMAL LOW (ref 22–32)

## 2023-12-22 LAB — PREPARE PLATELET PHERESIS
Unit division: 0
Unit division: 0
Unit division: 0

## 2023-12-22 LAB — BPAM PLATELET PHERESIS
Blood Product Expiration Date: 202506112359
Blood Product Expiration Date: 202506112359
Blood Product Expiration Date: 202506112359
ISSUE DATE / TIME: 202506091327
ISSUE DATE / TIME: 202506091357
ISSUE DATE / TIME: 202506091412
Unit Type and Rh: 5100
Unit Type and Rh: 6200
Unit Type and Rh: 6200

## 2023-12-22 LAB — COMPREHENSIVE METABOLIC PANEL WITH GFR
ALT: 13 U/L (ref 0–44)
AST: 25 U/L (ref 15–41)
Albumin: 2.5 g/dL — ABNORMAL LOW (ref 3.5–5.0)
Alkaline Phosphatase: 46 U/L (ref 38–126)
Anion gap: 23 — ABNORMAL HIGH (ref 5–15)
BUN: 11 mg/dL (ref 6–20)
CO2: 12 mmol/L — ABNORMAL LOW (ref 22–32)
Calcium: 7.6 mg/dL — ABNORMAL LOW (ref 8.9–10.3)
Chloride: 105 mmol/L (ref 98–111)
Creatinine, Ser: 1.31 mg/dL — ABNORMAL HIGH (ref 0.44–1.00)
GFR, Estimated: 52 mL/min — ABNORMAL LOW (ref >60)
Glucose, Bld: 303 mg/dL — ABNORMAL HIGH (ref 70–99)
Potassium: 3.2 mmol/L — ABNORMAL LOW (ref 3.5–5.1)
Sodium: 140 mmol/L (ref 135–145)
Total Bilirubin: 0.3 mg/dL (ref 0.0–1.2)
Total Protein: 5.1 g/dL — ABNORMAL LOW (ref 6.5–8.1)

## 2023-12-22 LAB — CBC
HCT: 32.9 % — ABNORMAL LOW (ref 36.0–46.0)
Hemoglobin: 10.9 g/dL — ABNORMAL LOW (ref 12.0–15.0)
MCH: 31.4 pg (ref 26.0–34.0)
MCHC: 33.1 g/dL (ref 30.0–36.0)
MCV: 94.8 fL (ref 80.0–100.0)
Platelets: 331 10*3/uL (ref 150–400)
RBC: 3.47 MIL/uL — ABNORMAL LOW (ref 3.87–5.11)
RDW: 13.9 % (ref 11.5–15.5)
WBC: 7.6 10*3/uL (ref 4.0–10.5)
nRBC: 0 % (ref 0.0–0.2)

## 2023-12-22 LAB — PROTIME-INR
INR: 1.2 (ref 0.8–1.2)
Prothrombin Time: 15.8 s — ABNORMAL HIGH (ref 11.4–15.2)

## 2023-12-22 LAB — MASSIVE TRANSFUSION PROTOCOL ORDER (BLOOD BANK NOTIFICATION)

## 2023-12-22 LAB — ABO/RH: ABO/RH(D): O POS

## 2023-12-22 LAB — ETHANOL: Alcohol, Ethyl (B): 21 mg/dL — ABNORMAL HIGH (ref <15)

## 2023-12-22 LAB — I-STAT CG4 LACTIC ACID, ED: Lactic Acid, Venous: 11.8 mmol/L (ref 0.5–1.9)

## 2023-12-22 SURGERY — CANCELLED PROCEDURE

## 2023-12-22 MED ORDER — DEXAMETHASONE SODIUM PHOSPHATE 10 MG/ML IJ SOLN
INTRAMUSCULAR | Status: AC
  Filled 2023-12-22: qty 1

## 2023-12-22 MED ORDER — CALCIUM CHLORIDE 10 % IV SOLN
INTRAVENOUS | Status: AC
  Filled 2023-12-22: qty 20

## 2023-12-22 MED ORDER — LIDOCAINE 2% (20 MG/ML) 5 ML SYRINGE
INTRAMUSCULAR | Status: AC
  Filled 2023-12-22: qty 5

## 2023-12-22 MED ORDER — EPINEPHRINE 1 MG/10ML IJ SOSY
PREFILLED_SYRINGE | INTRAMUSCULAR | Status: AC
  Filled 2023-12-22: qty 20

## 2023-12-22 MED ORDER — CEFAZOLIN SODIUM 1 G IJ SOLR
INTRAMUSCULAR | Status: AC
  Filled 2023-12-22: qty 20

## 2023-12-22 MED ORDER — SODIUM CHLORIDE 0.9 % IV SOLN: INTRAVENOUS | Status: DC | PRN

## 2023-12-22 MED ORDER — CALCIUM CHLORIDE 10 % IV SOLN
INTRAVENOUS | Status: AC | PRN
  Administered 2023-12-22: 1 g via INTRAVENOUS

## 2023-12-22 MED ORDER — SODIUM CHLORIDE 0.9% IV SOLUTION: Freq: Once | INTRAVENOUS | Status: DC

## 2023-12-22 MED ORDER — LACTATED RINGERS IV SOLN: INTRAVENOUS | Status: DC | PRN

## 2023-12-22 MED ORDER — ETOMIDATE 2 MG/ML IV SOLN
INTRAVENOUS | Status: AC | PRN
  Administered 2023-12-22: 20 mg via INTRAVENOUS

## 2023-12-22 MED ORDER — ROCURONIUM BROMIDE 10 MG/ML (PF) SYRINGE
PREFILLED_SYRINGE | INTRAVENOUS | Status: AC
  Filled 2023-12-22: qty 10

## 2023-12-22 MED ORDER — SUCCINYLCHOLINE CHLORIDE 200 MG/10ML IV SOSY
PREFILLED_SYRINGE | INTRAVENOUS | Status: AC
  Filled 2023-12-22: qty 10

## 2023-12-22 MED ORDER — CALCIUM CHLORIDE 10 % IV SOLN
INTRAVENOUS | Status: DC | PRN
  Administered 2023-12-22 (×3): 1 g via INTRAVENOUS

## 2023-12-22 MED ORDER — SODIUM BICARBONATE 8.4 % IV SOLN
INTRAVENOUS | Status: AC
  Filled 2023-12-22: qty 50

## 2023-12-22 MED ORDER — EPINEPHRINE 1 MG/10ML IJ SOSY
PREFILLED_SYRINGE | INTRAMUSCULAR | Status: AC | PRN
  Administered 2023-12-22 (×7): 1 mg via INTRAVENOUS

## 2023-12-22 MED ORDER — CALCIUM GLUCONATE-NACL 1-0.675 GM/50ML-% IV SOLN: 1.0000 g | Freq: Once | INTRAVENOUS | Status: DC

## 2023-12-22 MED ORDER — 0.9 % SODIUM CHLORIDE (POUR BTL) OPTIME
TOPICAL | Status: DC | PRN
  Administered 2023-12-22: 2000 mL

## 2023-12-22 MED ORDER — SUCCINYLCHOLINE CHLORIDE 20 MG/ML IJ SOLN
INTRAMUSCULAR | Status: AC | PRN
  Administered 2023-12-22: 150 mg via INTRAVENOUS

## 2023-12-22 MED ORDER — FENTANYL CITRATE (PF) 250 MCG/5ML IJ SOLN
INTRAMUSCULAR | Status: AC
  Filled 2023-12-22: qty 5

## 2023-12-22 MED ORDER — ONDANSETRON HCL 4 MG/2ML IJ SOLN
INTRAMUSCULAR | Status: AC
  Filled 2023-12-22: qty 2

## 2023-12-22 MED ORDER — SODIUM BICARBONATE 8.4 % IV SOLN
INTRAVENOUS | Status: DC | PRN
Start: 2023-12-22 — End: 2023-12-22
  Administered 2023-12-22: 50 meq via INTRAVENOUS

## 2023-12-22 MED ORDER — EPINEPHRINE 1 MG/10ML IJ SOSY
PREFILLED_SYRINGE | INTRAMUSCULAR | Status: DC | PRN
Start: 2023-12-22 — End: 2023-12-22
  Administered 2023-12-22: 1 mg via INTRATRACHEAL

## 2023-12-22 SURGICAL SUPPLY — 35 items
BAG COUNTER SPONGE SURGICOUNT (BAG) ×1 IMPLANT
BLADE CLIPPER SURG (BLADE) IMPLANT
CANISTER SUCTION 3000ML PPV (SUCTIONS) ×1 IMPLANT
CHLORAPREP W/TINT 26 (MISCELLANEOUS) ×1 IMPLANT
COVER SURGICAL LIGHT HANDLE (MISCELLANEOUS) ×1 IMPLANT
DRAPE LAPAROSCOPIC ABDOMINAL (DRAPES) ×1 IMPLANT
DRAPE WARM FLUID 44X44 (DRAPES) ×1 IMPLANT
DRSG OPSITE POSTOP 4X10 (GAUZE/BANDAGES/DRESSINGS) IMPLANT
DRSG OPSITE POSTOP 4X8 (GAUZE/BANDAGES/DRESSINGS) IMPLANT
ELECT BLADE 6.5 EXT (BLADE) IMPLANT
ELECT CAUTERY BLADE 6.4 (BLADE) ×1 IMPLANT
ELECTRODE REM PT RTRN 9FT ADLT (ELECTROSURGICAL) ×1 IMPLANT
GLOVE BIO SURGEON STRL SZ8 (GLOVE) ×1 IMPLANT
GLOVE BIOGEL PI IND STRL 8 (GLOVE) ×1 IMPLANT
GOWN STRL REUS W/ TWL LRG LVL3 (GOWN DISPOSABLE) ×1 IMPLANT
GOWN STRL REUS W/ TWL XL LVL3 (GOWN DISPOSABLE) ×1 IMPLANT
HANDLE SUCTION POOLE (INSTRUMENTS) ×1 IMPLANT
KIT BASIN OR (CUSTOM PROCEDURE TRAY) ×1 IMPLANT
KIT TURNOVER KIT B (KITS) ×1 IMPLANT
LIGASURE IMPACT 36 18CM CVD LR (INSTRUMENTS) IMPLANT
NS IRRIG 1000ML POUR BTL (IV SOLUTION) ×2 IMPLANT
PACK GENERAL/GYN (CUSTOM PROCEDURE TRAY) ×1 IMPLANT
PAD ARMBOARD POSITIONER FOAM (MISCELLANEOUS) ×1 IMPLANT
SPECIMEN JAR LARGE (MISCELLANEOUS) IMPLANT
SPONGE T-LAP 18X18 ~~LOC~~+RFID (SPONGE) IMPLANT
STAPLER SKIN PROX 35W (STAPLE) ×1 IMPLANT
SUT PDS AB 1 TP1 96 (SUTURE) ×2 IMPLANT
SUT SILK 2 0 SH CR/8 (SUTURE) ×1 IMPLANT
SUT SILK 2 0 TIES 10X30 (SUTURE) ×1 IMPLANT
SUT SILK 3 0 SH CR/8 (SUTURE) ×1 IMPLANT
SUT SILK 3 0 TIES 10X30 (SUTURE) ×1 IMPLANT
TOWEL GREEN STERILE (TOWEL DISPOSABLE) ×1 IMPLANT
TRAY FOLEY MTR SLVR 16FR STAT (SET/KITS/TRAYS/PACK) IMPLANT
TRAY FOLEY W/BAG SLVR 14FR LF (SET/KITS/TRAYS/PACK) IMPLANT
YANKAUER SUCT BULB TIP NO VENT (SUCTIONS) IMPLANT

## 2023-12-22 NOTE — Anesthesia Preprocedure Evaluation (Signed)
 Anesthesia Evaluation    Airway        Dental   Pulmonary           Cardiovascular hypertension,      Neuro/Psych   Anxiety        GI/Hepatic   Endo/Other    Renal/GU      Musculoskeletal   Abdominal   Peds  Hematology   Anesthesia Other Findings   Reproductive/Obstetrics                             Anesthesia Physical Anesthesia Plan Anesthesia Quick Evaluation
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
## 2023-12-22 NOTE — Anesthesia Procedure Notes (Signed)
 Arterial Line Insertion Start/End06/23/2025 1:45 PM,  1:55 PM Performed by: Robert Chimes, CRNA, CRNA  Patient location: OR. Preanesthetic checklist: patient identified, IV checked, site marked, risks and benefits discussed, surgical consent, monitors and equipment checked, pre-op evaluation, timeout performed and anesthesia consent Lidocaine  1% used for infiltration Left, radial was placed Catheter size: 20 G Hand hygiene performed  and maximum sterile barriers used   Attempts: 1 Procedure performed using ultrasound guided technique. Ultrasound Notes:anatomy identified Following insertion, dressing applied. Post procedure assessment: normal and unchanged  Additional procedure comments: Arterial line placed during active coding with minimal blood pressure and continuous chest compressions. Dark red blood return could not confirm arterial or venous. Time of death called before further investigation if correctly placed. Aaron Aas

## 2023-12-22 NOTE — Procedures (Signed)
   Procedure Note  Date: 01/01/2024  Procedure: central venous catheter placement--right, femoral vein, without ultrasound guidance  Pre-op diagnosis: ACLS Post-op diagnosis: same  Surgeon: Anda Bamberg, MD  Anesthesia: local  EBL: <5cc Drains/Implants: dual lumen central venous catheter  Description of procedure: This procedure was performed emergently, therefore informed consent was not obtained, and was performed under non-sterile conditions. The right femoral  vein was localized using anatomic landmarks, accessed using an introducer needle, and a guidewire passed through the needle. The needle was removed and a skin nick was made. The tract was dilated and the central venous catheter advanced over the guidewire followed by removal of the guidewire. All ports drew blood easily and all were flushed with saline. The catheter was secured to the skin with suture.   Anda Bamberg, MD General and Trauma Surgery [facility] Surgery
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
## 2023-12-22 NOTE — ED Notes (Signed)
 Pulled 2 units whole blood, 2 units FFP, and 2 units of PRBCs form ED trauma room fridge

## 2023-12-22 NOTE — ED Notes (Signed)
 Blood bank notified of MTP

## 2023-12-22 NOTE — Procedures (Signed)
 Central line  Date/Time: 01/12/2024 2:14 PM  Performed by: Dorena Gander, MD Authorized by: Dorena Gander, MD   Consent:    Consent obtained:  Emergent situation Pre-procedure details:    Indication(s): central venous access     Hand hygiene: Hand hygiene performed prior to insertion     Sterile barrier technique: All elements of maximal sterile technique followed     Skin preparation:  Chlorhexidine Anesthesia:    Anesthesia method:  Local infiltration Procedure details:    Location:  L subclavian   Procedural supplies:  Cordis   Number of attempts:  2 Comments:     Initially gained access and threaded a wire but could not pass large triple lumen line. I did a new stick and gained access and the cordis was placed. It pulled back and flushed easily.  Dorena Gander, MD, MPH, FACS Please use [email or web address] to contact on call provider
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
## 2023-12-22 NOTE — Progress Notes (Signed)
   12/28/2023 1300  Spiritual Encounters  Type of Visit Attempt (pt unavailable)  Care provided to: Pt not available  Conversation partners present during encounter Nurse  Referral source Trauma page  Reason for visit Trauma  OnCall Visit No    No family present at this time. Pt unavailable. Chaplains remain available.

## 2023-12-22 NOTE — ED Notes (Signed)
 Lactic acid and cm 8 results abnormal given to scoot goldstin md by at
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
## 2023-12-22 NOTE — Transfer of Care (Signed)
 Immediate Anesthesia Transfer of Care Note  Patient: Terry Osborn  Procedure(s) Performed: CANCELLED PROCEDURE  Patient Location: deceased  Anesthesia Type:General  Level of Consciousness: Patient remains intubated per anesthesia plan  Airway & Oxygen Therapy: deceased  Post-op Assessment: decesead  Post vital signs: unstable  Last Vitals:  Vitals Value Taken Time  BP 0   Temp 0   Pulse 0   Resp 0   SpO2 0     Last Pain:  Vitals:   01/07/2024 1309  TempSrc: Oral         Complications: No notable events documented.
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
## 2023-12-23 ENCOUNTER — Encounter: Payer: Self-pay | Admitting: Family Medicine

## 2023-12-23 LAB — BPAM CRYOPRECIPITATE
Blood Product Expiration Date: 202506102359
Blood Product Expiration Date: 202506142359
ISSUE DATE / TIME: 202506091332
Unit Type and Rh: 6200
Unit Type and Rh: 6200

## 2023-12-23 LAB — PREPARE CRYOPRECIPITATE
Unit division: 0
Unit division: 0

## 2023-12-24 LAB — PREPARE FRESH FROZEN PLASMA
Unit division: 0
Unit division: 0
Unit division: 0
Unit division: 0
Unit division: 0
Unit division: 0
Unit division: 0
Unit division: 0
Unit division: 0
Unit division: 0
Unit division: 0
Unit division: 0

## 2023-12-24 LAB — TYPE AND SCREEN
ABO/RH(D): O POS
Antibody Screen: NEGATIVE
Unit division: 0
Unit division: 0
Unit division: 0
Unit division: 0
Unit division: 0
Unit division: 0
Unit division: 0
Unit division: 0
Unit division: 0
Unit division: 0
Unit division: 0
Unit division: 0
Unit division: 0
Unit division: 0
Unit division: 0
Unit division: 0
Unit division: 0
Unit division: 0
Unit division: 0
Unit division: 0
Unit division: 0
Unit division: 0
Unit division: 0
Unit division: 0

## 2023-12-24 LAB — BPAM RBC
Blood Product Expiration Date: 202507092359
Blood Product Expiration Date: 202507072359
Blood Product Expiration Date: 202507072359
Blood Product Expiration Date: 202507072359
Blood Product Expiration Date: 202507072359
Blood Product Unit Number: 202506182359
Blood Product Unit Number: 202506292359
Blood Product Unit Number: 202507092359
Blood Product Unit Number: 202507082359
Blood Product Unit Number: 202507082359
Blood Product Unit Number: 202507082359
ISSUE DATE / TIME: 202506091333
ISSUE DATE / TIME: 202506091333
ISSUE DATE / TIME: 202506091333
ISSUE DATE / TIME: 202506091333
ISSUE DATE / TIME: 202506091350
ISSUE DATE / TIME: 202506102233
ISSUE DATE / TIME: 202507072359
ISSUE DATE / TIME: 202507082359
ISSUE DATE / TIME: 202507082359
ISSUE DATE / TIME: 202507082359
ISSUE DATE / TIME: 202507082359
PRODUCT CODE: 202506091525
PRODUCT CODE: 202506182359
PRODUCT CODE: 202506292359
PRODUCT CODE: 202506182359
PRODUCT CODE: 202506182359
PRODUCT CODE: 202507082359
PRODUCT CODE: 202507082359
PRODUCT CODE: 202507082359
PRODUCT CODE: 202507102359
PRODUCT CODE: 202507102359
PRODUCT CODE: 202507102359
PRODUCT CODE: 202507102359
PRODUCT CODE: 202507102359
PRODUCT CODE: 202507102359
PRODUCT CODE: 202507102359
PRODUCT CODE: 202507102359
PRODUCT CODE: 202507112359
PRODUCT CODE: 202507112359
PRODUCT CODE: 202507112359
PRODUCT CODE: 202507112359
PRODUCT CODE: 202507112359
PRODUCT CODE: 202507112359
PRODUCT CODE: 202507112359
PRODUCT CODE: 202507112359
Unit Type and Rh: 9500
Unit Type and Rh: 202506091321
Unit Type and Rh: 202506091321
Unit Type and Rh: 202506091525
Unit Type and Rh: 9500
Unit Type and Rh: 202506091259
Unit Type and Rh: 202506091259
Unit Type and Rh: 202506091311
Unit Type and Rh: 202506091311
Unit Type and Rh: 202506091324
Unit Type and Rh: 202506091324
Unit Type and Rh: 202506091324
Unit Type and Rh: 202506091324
Unit Type and Rh: 202506091324
Unit Type and Rh: 202506091324
Unit Type and Rh: 202506091324
Unit Type and Rh: 202506091324
Unit Type and Rh: 202506091324
Unit Type and Rh: 202506091324
Unit Type and Rh: 202506091324
Unit Type and Rh: 202506091324
Unit Type and Rh: 202506091324
Unit Type and Rh: 202506091324
Unit Type and Rh: 202506091324
Unit Type and Rh: 202506091324
Unit Type and Rh: 202506091350
Unit Type and Rh: 202506091350
Unit Type and Rh: 202506091350
Unit Type and Rh: 202506091402
Unit Type and Rh: 202506091402
Unit Type and Rh: 202506091402
Unit Type and Rh: 202506091402
Unit Type and Rh: 202506101207
Unit Type and Rh: 202506182359
Unit Type and Rh: 202506182359
Unit Type and Rh: 202507072359
Unit Type and Rh: 202507072359
Unit Type and Rh: 202507082359
Unit Type and Rh: 202507082359
Unit Type and Rh: 202507082359
Unit Type and Rh: 202507082359
Unit Type and Rh: 202507082359
Unit Type and Rh: 202507082359
Unit Type and Rh: 202507082359
Unit Type and Rh: 202507082359
Unit Type and Rh: 202507102359
Unit Type and Rh: 202507102359
Unit Type and Rh: 202507102359
Unit Type and Rh: 202507102359
Unit Type and Rh: 202507112359
Unit Type and Rh: 202507112359
Unit Type and Rh: 202507112359
Unit Type and Rh: 202507112359
Unit Type and Rh: 5100
Unit Type and Rh: 5100
Unit Type and Rh: 5100
Unit Type and Rh: 5100
Unit Type and Rh: 5100
Unit Type and Rh: 5100

## 2023-12-24 LAB — BPAM FFP
Blood Product Expiration Date: 202506202359
Blood Product Expiration Date: 202506202359
Blood Product Expiration Date: 202506092359
Blood Product Expiration Date: 202506092359
Blood Product Expiration Date: 202506092359
Blood Product Expiration Date: 202506092359
Blood Product Expiration Date: 202506092359
Blood Product Expiration Date: 202506092359
Blood Product Expiration Date: 202506092359
Blood Product Expiration Date: 202506092359
Blood Product Expiration Date: 202506092359
Blood Product Expiration Date: 202506092359
Blood Product Expiration Date: 202506092359
Blood Product Expiration Date: 202506092359
Blood Product Expiration Date: 202506132359
Blood Product Expiration Date: 202506132359
Blood Product Expiration Date: 202506202359
Blood Product Expiration Date: 202506212359
Blood Product Expiration Date: 202506212359
Blood Product Expiration Date: 202506222359
Blood Product Expiration Date: 202506222359
Blood Product Expiration Date: 202506272359
ISSUE DATE / TIME: 202506091326
ISSUE DATE / TIME: 202506091326
ISSUE DATE / TIME: 202506091324
ISSUE DATE / TIME: 202506091324
ISSUE DATE / TIME: 202506091324
ISSUE DATE / TIME: 202506091324
ISSUE DATE / TIME: 202506091324
ISSUE DATE / TIME: 202506091324
ISSUE DATE / TIME: 202506091324
ISSUE DATE / TIME: 202506091324
ISSUE DATE / TIME: 202506091336
ISSUE DATE / TIME: 202506091336
ISSUE DATE / TIME: 202506091336
ISSUE DATE / TIME: 202506091336
ISSUE DATE / TIME: 202506091352
ISSUE DATE / TIME: 202506091352
ISSUE DATE / TIME: 202506091352
ISSUE DATE / TIME: 202506091352
ISSUE DATE / TIME: 202506091408
ISSUE DATE / TIME: 202506091408
ISSUE DATE / TIME: 202506091408
ISSUE DATE / TIME: 202506091408
Unit Type and Rh: 6200
Unit Type and Rh: 6200
Unit Type and Rh: 600
Unit Type and Rh: 600
Unit Type and Rh: 6200
Unit Type and Rh: 6200
Unit Type and Rh: 6200
Unit Type and Rh: 6200
Unit Type and Rh: 6200
Unit Type and Rh: 6200
Unit Type and Rh: 6200
Unit Type and Rh: 6200
Unit Type and Rh: 6200
Unit Type and Rh: 6200
Unit Type and Rh: 6200
Unit Type and Rh: 6200
Unit Type and Rh: 6200
Unit Type and Rh: 6200
Unit Type and Rh: 6200
Unit Type and Rh: 6200
Unit Type and Rh: 6200
Unit Type and Rh: 6200

## 2023-12-25 DIAGNOSIS — Z419 Encounter for procedure for purposes other than remedying health state, unspecified: Secondary | ICD-10-CM | POA: Diagnosis not present

## 2023-12-29 ENCOUNTER — Other Ambulatory Visit (HOSPITAL_COMMUNITY): Payer: Self-pay
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
## 2024-01-13 NOTE — Progress Notes (Signed)
 As the patient did not survive to have a laparotomy, the presence of peritoneal penetration is unknown.

## 2024-01-13 DEATH — deceased

## 2024-02-18 ENCOUNTER — Ambulatory Visit: Admitting: Family Medicine

## 2024-03-23 MED FILL — Medication: Qty: 1 | Status: AC
